# Patient Record
Sex: Female | Born: 1960 | Race: Black or African American | Hispanic: No | Marital: Married | Smoking: Never smoker
Health system: Southern US, Community
[De-identification: ages and names within clinical notes are randomized; demographics above are authoritative.]

## PROBLEM LIST (undated history)

## (undated) DIAGNOSIS — E1169 Type 2 diabetes mellitus with other specified complication: Secondary | ICD-10-CM

## (undated) DIAGNOSIS — E785 Hyperlipidemia, unspecified: Secondary | ICD-10-CM

## (undated) DIAGNOSIS — B9689 Other specified bacterial agents as the cause of diseases classified elsewhere: Secondary | ICD-10-CM

## (undated) DIAGNOSIS — R87629 Unspecified abnormal cytological findings in specimens from vagina: Secondary | ICD-10-CM

## (undated) DIAGNOSIS — T8859XA Other complications of anesthesia, initial encounter: Secondary | ICD-10-CM

## (undated) DIAGNOSIS — J45909 Unspecified asthma, uncomplicated: Secondary | ICD-10-CM

## (undated) DIAGNOSIS — N898 Other specified noninflammatory disorders of vagina: Secondary | ICD-10-CM

## (undated) DIAGNOSIS — IMO0002 Reserved for concepts with insufficient information to code with codable children: Secondary | ICD-10-CM

## (undated) DIAGNOSIS — T4145XA Adverse effect of unspecified anesthetic, initial encounter: Secondary | ICD-10-CM

## (undated) DIAGNOSIS — D649 Anemia, unspecified: Secondary | ICD-10-CM

## (undated) DIAGNOSIS — N760 Acute vaginitis: Secondary | ICD-10-CM

## (undated) DIAGNOSIS — R569 Unspecified convulsions: Secondary | ICD-10-CM

## (undated) HISTORY — DX: Anemia, unspecified: D64.9

## (undated) HISTORY — PX: TUBAL LIGATION: SHX77

## (undated) HISTORY — DX: Hyperlipidemia, unspecified: E78.5

## (undated) HISTORY — DX: Unspecified asthma, uncomplicated: J45.909

## (undated) HISTORY — DX: Unspecified convulsions: R56.9

## (undated) HISTORY — DX: Other specified bacterial agents as the cause of diseases classified elsewhere: B96.89

## (undated) HISTORY — DX: Reserved for concepts with insufficient information to code with codable children: IMO0002

## (undated) HISTORY — DX: Acute vaginitis: N76.0

## (undated) HISTORY — DX: Other specified noninflammatory disorders of vagina: N89.8

## (undated) HISTORY — DX: Unspecified abnormal cytological findings in specimens from vagina: R87.629

---
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
## 1988-01-11 DIAGNOSIS — R569 Unspecified convulsions: Secondary | ICD-10-CM

## 1988-01-11 HISTORY — DX: Unspecified convulsions: R56.9

## 1988-01-11 HISTORY — PX: BRAIN SURGERY: SHX531

## 1997-01-10 DIAGNOSIS — R87619 Unspecified abnormal cytological findings in specimens from cervix uteri: Secondary | ICD-10-CM

## 1997-01-10 DIAGNOSIS — IMO0002 Reserved for concepts with insufficient information to code with codable children: Secondary | ICD-10-CM

## 1997-01-10 HISTORY — DX: Reserved for concepts with insufficient information to code with codable children: IMO0002

## 1997-01-10 HISTORY — PX: GYNECOLOGIC CRYOSURGERY: SHX857

## 1997-01-10 HISTORY — DX: Unspecified abnormal cytological findings in specimens from cervix uteri: R87.619

## 2012-04-30 ENCOUNTER — Telehealth: Payer: Self-pay | Admitting: Nurse Practitioner

## 2012-04-30 ENCOUNTER — Encounter: Payer: Self-pay | Admitting: *Deleted

## 2012-04-30 NOTE — Telephone Encounter (Signed)
Appt made

## 2012-05-03 ENCOUNTER — Ambulatory Visit (INDEPENDENT_AMBULATORY_CARE_PROVIDER_SITE_OTHER): Payer: BC Managed Care – PPO | Admitting: Nurse Practitioner

## 2012-05-03 ENCOUNTER — Encounter: Payer: Self-pay | Admitting: Nurse Practitioner

## 2012-05-03 VITALS — BP 100/58 | HR 63 | Temp 98.3°F | Ht 62.0 in | Wt 158.0 lb

## 2012-05-03 DIAGNOSIS — N949 Unspecified condition associated with female genital organs and menstrual cycle: Secondary | ICD-10-CM

## 2012-05-03 DIAGNOSIS — N938 Other specified abnormal uterine and vaginal bleeding: Secondary | ICD-10-CM

## 2012-05-03 MED ORDER — MEDROXYPROGESTERONE ACETATE 10 MG PO TABS: ORAL_TABLET | ORAL | Status: DC

## 2012-05-03 NOTE — Progress Notes (Signed)
  Subjective:    Patient ID: Suzanne Jimenez, female    DOB: October 28, 1960, 52 y.o.   MRN: [id number]  HPI Pt presents today with menstrual cycle problems. Has been going on consistently for 5 weeks. Has been inconsistent for a over a year. Varies from day to day on how heavy the bleeding is. Pt is not on birth control.   Review of Systems  Genitourinary: Positive for menstrual problem. Negative for vaginal discharge, vaginal pain and pelvic pain.  All other systems reviewed and are negative.      Objective:   Physical Exam  Cardiovascular: Normal rate, regular rhythm and normal heart sounds.   Pulmonary/Chest: Effort normal and breath sounds normal.    BP 100/58  Pulse 63  Temp(Src) 98.3 F (36.8 C) (Oral)  Ht 5\' 2"  (1.575 m)  Wt 158 lb (71.668 kg)  BMI 28.89 kg/m2  Results for orders placed in visit on 05/03/12  POCT HEMOGLOBIN      Result Value Range   Hemoglobin 10.1 (*) 12.2 - 16.2 g/dL         Assessment & Plan:  1. Dysfunctional uterine bleeding Iron pill OTC Notify if bleeding does not stop - POCT hemoglobin - medroxyPROGESTERone (PROVERA) 10 MG tablet; Once a day for 10 days  Dispense: 10 tablet; Refill: 0   Mary-Margaret Daphine Deutscher, FNP

## 2012-05-03 NOTE — Patient Instructions (Addendum)
Dysfunctional Uterine Bleeding  Normally, menstrual periods begin between ages 11 to 17 in young women. A normal menstrual cycle/period may begin every 23 days up to 35 days and lasts from 1 to 7 days. Around 12 to 14 days before your menstrual period starts, ovulation (ovary produces an egg) occurs. When counting the time between menstrual periods, count from the first day of bleeding of the previous period to the first day of bleeding of the next period.  Dysfunctional (abnormal) uterine bleeding is bleeding that is different from a normal menstrual period. Your periods may come earlier or later than usual. They may be lighter, have blood clots or be heavier. You may have bleeding between periods, or you may skip one period or more. You may have bleeding after sexual intercourse, bleeding after menopause, or no menstrual period.  CAUSES   · Pregnancy (normal, miscarriage, tubal).  · IUDs (intrauterine device, birth control).  · Birth control pills.  · Hormone treatment.  · Menopause.  · Infection of the cervix.  · Blood clotting problems.  · Infection of the inside lining of the uterus.  · Endometriosis, inside lining of the uterus growing in the pelvis and other female organs.  · Adhesions (scar tissue) inside the uterus.  · Obesity or severe weight loss.  · Uterine polyps inside the uterus.  · Cancer of the vagina, cervix, or uterus.  · Ovarian cysts or polycystic ovary syndrome.  · Medical problems (diabetes, thyroid disease).  · Uterine fibroids (noncancerous tumor).  · Problems with your female hormones.  · Endometrial hyperplasia, very thick lining and enlarged cells inside of the uterus.  · Medicines that interfere with ovulation.  · Radiation to the pelvis or abdomen.  · Chemotherapy.  DIAGNOSIS   · Your doctor will discuss the history of your menstrual periods, medicines you are taking, changes in your weight, stress in your life, and any medical problems you may have.  · Your doctor will do a physical  and pelvic examination.  · Your doctor may want to perform certain tests to make a diagnosis, such as:  · Pap test.  · Blood tests.  · Cultures for infection.  · CT scan.  · Ultrasound.  · Hysteroscopy.  · Laparoscopy.  · MRI.  · Hysterosalpingography.  · D and C.  · Endometrial biopsy.  TREATMENT   Treatment will depend on the cause of the dysfunctional uterine bleeding (DUB). Treatment may include:  · Observing your menstrual periods for a couple of months.  · Prescribing medicines for medical problems, including:  · Antibiotics.  · Hormones.  · Birth control pills.  · Removing an IUD (intrauterine device, birth control).  · Surgery:  · D and C (scrape and remove tissue from inside the uterus).  · Laparoscopy (examine inside the abdomen with a lighted tube).  · Uterine ablation (destroy lining of the uterus with electrical current, laser, heat, or freezing).  · Hysteroscopy (examine cervix and uterus with a lighted tube).  · Hysterectomy (remove the uterus).  HOME CARE INSTRUCTIONS   · If medicines were prescribed, take exactly as directed. Do not change or switch medicines without consulting your caregiver.  · Long term heavy bleeding may result in iron deficiency. Your caregiver may have prescribed iron pills. They help replace the iron that your body lost from heavy bleeding. Take exactly as directed.  · Do not take aspirin or medicines that contain aspirin one week before or during your menstrual period. Aspirin may make   the bleeding worse.  · If you need to change your sanitary pad or tampon more than once every 2 hours, stay in bed with your feet elevated and a cold pack on your lower abdomen. Rest as much as possible, until the bleeding stops or slows down.  · Eat well-balanced meals. Eat foods high in iron. Examples are:  · Leafy green vegetables.  · Whole-grain breads and cereals.  · Eggs.  · Meat.  · Liver.  · Do not try to lose weight until the abnormal bleeding has stopped and your blood iron level is  back to normal. Do not lift more than ten pounds or do strenuous activities when you are bleeding.  · For a couple of months, make note on your calendar, marking the start and ending of your period, and the type of bleeding (light, medium, heavy, spotting, clots or missed periods). This is for your caregiver to better evaluate your problem.  SEEK MEDICAL CARE IF:   · You develop nausea (feeling sick to your stomach) and vomiting, dizziness, or diarrhea while you are taking your medicine.  · You are getting lightheaded or weak.  · You have any problems that may be related to the medicine you are taking.  · You develop pain with your DUB.  · You want to remove your IUD.  · You want to stop or change your birth control pills or hormones.  · You have any type of abnormal bleeding mentioned above.  · You are over 16 years old and have not had a menstrual period yet.  · You are 52 years old and you are still having menstrual periods.  · You have any of the symptoms mentioned above.  · You develop a rash.  SEEK IMMEDIATE MEDICAL CARE IF:   · An oral temperature above 102° F (38.9° C) develops.  · You develop chills.  · You are changing your sanitary pad or tampon more than once an hour.  · You develop abdominal pain.  · You pass out or faint.  Document Released: 12/25/1999 Document Revised: 03/21/2011 Document Reviewed: 11/25/2008  ExitCare® Patient Information ©2013 [employer].

## 2012-05-14 ENCOUNTER — Encounter: Payer: Self-pay | Admitting: Obstetrics and Gynecology

## 2012-06-22 ENCOUNTER — Ambulatory Visit (INDEPENDENT_AMBULATORY_CARE_PROVIDER_SITE_OTHER): Payer: BC Managed Care – PPO | Admitting: Family Medicine

## 2012-06-22 ENCOUNTER — Encounter: Payer: Self-pay | Admitting: Family Medicine

## 2012-06-22 VITALS — BP 120/82 | HR 75 | Temp 98.7°F | Wt 161.0 lb

## 2012-06-22 DIAGNOSIS — D649 Anemia, unspecified: Secondary | ICD-10-CM

## 2012-06-22 MED ORDER — FERROUS SULFATE 325 (65 FE) MG PO TABS: 325.0000 mg | ORAL_TABLET | Freq: Three times a day (TID) | ORAL | Status: DC

## 2012-06-22 NOTE — Progress Notes (Signed)
  Subjective:    Patient ID: Suzanne Jimenez, female    DOB: 1960-01-16, 52 y.o.   MRN: [id number]  HPI Patient presents today with chief complaint of irregular menses. Patient states she's been having irregular periods in the past 5-6 months. Patient states her she goes missing a period for about 1-2 months and then has extended cycles lasting 1-2 weeks Patient was recently seen for irregular menses and placed on a short course of Provera. Patient states that the bleeding subsided while on Provera but that quickly returned within 2-3 days of stopping the medication. Patient has had some secondary anemia from this with a baseline hemoglobin around 10.1. Patient was started on over-the-counter iron. Patient is unsure this is been effective or not is unclear the dosing. Patient denies any significant weakness however does have some mild to minimal fatigue.  Patient is also is not using more than one pad an hour. She had to use one to pad per day in the first 1-2 days of her cycle  Review of Systems  All other systems reviewed and are negative.       Objective:   Physical Exam  Constitutional: She appears well-developed and well-nourished.  HENT:  Head: Normocephalic and atraumatic.  Eyes: Conjunctivae are normal. Pupils are equal, round, and reactive to light.  Neck: Normal range of motion.  Cardiovascular: Normal rate and regular rhythm.   Pulmonary/Chest: Effort normal and breath sounds normal.  Abdominal: Soft.  Musculoskeletal: Normal range of motion.  Neurological: She is alert.  Skin: Skin is warm.          Assessment & Plan:  Anemia - Plan: Anemia panel, ferrous sulfate 325 (65 FE) MG tablet, CANCELED: CBC  Suspect this is likely perimenopausal bleeding. Hgb today stable.  Will check anemia panel.  Place on TID iron with miralax.  Plan for follow up with GYN for further evaluation.  Consider another trial of provera if GYN follow up is extended.

## 2012-06-23 LAB — ANEMIA PANEL
%SAT: 20 % (ref 20–55)
Folate: 11.2 ng/mL
Iron: 74 ug/dL (ref 42–145)
Retic Ct Pct: 1.1 % (ref 0.4–2.3)
TIBC: 378 ug/dL (ref 250–470)
UIBC: 304 ug/dL (ref 125–400)
Vitamin B-12: 443 pg/mL (ref 211–911)

## 2012-07-03 ENCOUNTER — Encounter: Payer: Self-pay | Admitting: Obstetrics & Gynecology

## 2012-07-03 ENCOUNTER — Ambulatory Visit (INDEPENDENT_AMBULATORY_CARE_PROVIDER_SITE_OTHER): Payer: BC Managed Care – PPO | Admitting: Obstetrics & Gynecology

## 2012-07-03 VITALS — BP 114/73 | HR 77 | Resp 16 | Wt 163.0 lb

## 2012-07-03 DIAGNOSIS — N938 Other specified abnormal uterine and vaginal bleeding: Secondary | ICD-10-CM | POA: Insufficient documentation

## 2012-07-03 DIAGNOSIS — N949 Unspecified condition associated with female genital organs and menstrual cycle: Secondary | ICD-10-CM

## 2012-07-03 MED ORDER — MEDROXYPROGESTERONE ACETATE 5 MG PO TABS: 5.0000 mg | ORAL_TABLET | Freq: Every day | ORAL | Status: DC

## 2012-07-03 NOTE — Progress Notes (Signed)
Patient ID: Suzanne Jimenez, female   DOB: 08/16/1960, 52 y.o.   MRN: [id number]  Chief Complaint  Patient presents with  . Menstrual Problem    20 days on 2 days off times 3months    HPI Suzanne Jimenez is a 52 y.o. female.  Patient's last menstrual period was 06/22/2012. [id number] Irregular, almost daily vaginal bleeding for last 3 months. Responded to short courses of Provera. Has VMS.   HPI  Past Medical History  Diagnosis Date  . Seizures 1990  . Asthma   . Anemia   . Hyperlipidemia   . Abnormal Pap smear 1999    Past Surgical History  Procedure Laterality Date  . Tubal ligation    . Brain surgery  1990  . Cesarean section  2004  . Gynecologic cryosurgery  1999    Abnormal Pap    Family History  Problem Relation Age of Onset  . Diabetes Mother   . Diabetes Father   . Heart disease Brother   . Heart disease Father   . Hypertension Mother   . Hypertension Father     Social History History  Substance Use Topics  . Smoking status: Never Smoker   . Smokeless tobacco: Never Used  . Alcohol Use: No    Allergies  Allergen Reactions  . Dilantin (Phenytoin)     Current Outpatient Prescriptions  Medication Sig Dispense Refill  . carbamazepine (TEGRETOL XR) 200 MG 12 hr tablet Take 200 mg by mouth 2 (two) times daily.      . ferrous sulfate 325 (65 FE) MG tablet Take 1 tablet (325 mg total) by mouth 3 (three) times daily with meals.  90 tablet  3  . fish oil-omega-3 fatty acids 1000 MG capsule Take 2 g by mouth daily.      . medroxyPROGESTERone (PROVERA) 5 MG tablet Take 1 tablet (5 mg total) by mouth daily.  30 tablet  6   No current facility-administered medications for this visit.    Review of Systems Review of Systems  Constitutional: Negative for fever.  Gastrointestinal: Negative for abdominal pain and abdominal distention.  Genitourinary: Positive for vaginal bleeding, vaginal discharge and menstrual problem.  Skin: Negative for  pallor.  Neurological: Negative for weakness.    Blood pressure 114/73, pulse 77, resp. rate 16, weight 163 lb (73.936 kg), last menstrual period 06/22/2012.  Physical Exam Physical Exam  Constitutional: She is oriented to person, place, and time. She appears well-developed. No distress.  Pulmonary/Chest: Effort normal. No respiratory distress.  Abdominal: Soft. She exhibits no distension and no mass. There is no tenderness.  Genitourinary: Vagina normal and uterus normal. No vaginal discharge found.  Spotting, no masses  Neurological: She is alert and oriented to person, place, and time.  Skin: Skin is warm and dry.  Psychiatric: She has a normal mood and affect. Her behavior is normal.    Data Reviewed CBC    Component Value Date/Time   HGB 10.1* 05/03/2012 1252      Assessment    Perimenopausal vaginal bleeding      Plan    Provera 5 mg daily Pelvic US, f/u after test performed        Chanc Kervin 07/03/2012, 11:28 AM

## 2012-07-03 NOTE — Patient Instructions (Signed)

## 2012-07-10 ENCOUNTER — Ambulatory Visit (HOSPITAL_COMMUNITY)
Admission: RE | Admit: 2012-07-10 | Discharge: 2012-07-10 | Disposition: A | Discharge status: Home / Self Care | Payer: BC Managed Care – PPO | Source: Ambulatory Visit | Attending: Obstetrics & Gynecology | Admitting: Obstetrics & Gynecology

## 2012-07-10 ENCOUNTER — Other Ambulatory Visit (HOSPITAL_COMMUNITY): Payer: BC Managed Care – PPO

## 2012-07-10 DIAGNOSIS — N72 Inflammatory disease of cervix uteri: Secondary | ICD-10-CM | POA: Insufficient documentation

## 2012-07-10 DIAGNOSIS — N938 Other specified abnormal uterine and vaginal bleeding: Secondary | ICD-10-CM | POA: Insufficient documentation

## 2012-07-10 DIAGNOSIS — R9389 Abnormal findings on diagnostic imaging of other specified body structures: Secondary | ICD-10-CM | POA: Insufficient documentation

## 2012-07-10 DIAGNOSIS — N949 Unspecified condition associated with female genital organs and menstrual cycle: Secondary | ICD-10-CM | POA: Insufficient documentation

## 2012-07-24 ENCOUNTER — Encounter: Payer: Self-pay | Admitting: Obstetrics & Gynecology

## 2012-07-24 ENCOUNTER — Ambulatory Visit (INDEPENDENT_AMBULATORY_CARE_PROVIDER_SITE_OTHER): Payer: BC Managed Care – PPO | Admitting: Obstetrics & Gynecology

## 2012-07-24 VITALS — BP 120/75 | HR 79 | Resp 16 | Ht 62.0 in | Wt 161.0 lb

## 2012-07-24 DIAGNOSIS — N938 Other specified abnormal uterine and vaginal bleeding: Secondary | ICD-10-CM

## 2012-07-24 DIAGNOSIS — N949 Unspecified condition associated with female genital organs and menstrual cycle: Secondary | ICD-10-CM

## 2012-07-24 NOTE — Progress Notes (Signed)
  Subjective:    Patient ID: Suzanne Jimenez, female    DOB: 04/12/1960, 51 y.o.   MRN: [id number]  HPI [id number] Patient's last menstrual period was 06/14/2012.  No bleeding for 1 week, no Provera now.   Review of Systems  Constitutional: Negative for fever.  Genitourinary: Positive for menstrual problem. Negative for vaginal bleeding, vaginal discharge and pelvic pain.       Objective:   Physical Exam  Nursing note and vitals reviewed. Constitutional: She is oriented to person, place, and time. She appears well-developed and well-nourished. No distress.  Pulmonary/Chest: Effort normal and breath sounds normal.  Neurological: She is alert and oriented to person, place, and time.  Psychiatric: She has a normal mood and affect. Her behavior is normal.      *RADIOLOGY REPORT*  Clinical Data: Dysfunctional uterine bleeding  TRANSABDOMINAL AND TRANSVAGINAL ULTRASOUND OF PELVIS  Technique: Both transabdominal and transvaginal ultrasound  examinations of the pelvis were performed. Transabdominal technique  was performed for global imaging of the pelvis including uterus,  ovaries, adnexal regions, and pelvic cul-de-sac.  It was necessary to proceed with endovaginal exam following the  transabdominal exam to visualize the endometrial complex and right  ovary.  Comparison: None  Findings:  Uterus: 12.0 x 4.8 x 6.8 cm. Normal morphology without mass.  Nabothian cysts at cervix.  Endometrium: Thickened, 17 mm thick. No endometrial fluid.  Right ovary: 2.5 x 1.5 1.4 cm. Normal morphology without mass.  Left ovary: 2.6 x 1.5 x 2.6 cm. Seen only on transabdominal  imaging due to high position. Normal morphology without mass.  Other findings: No free fluid or adnexal masses.  IMPRESSION:  Thickened endometrial complex, nonspecific, can be seen with  hyperplasia, polyps, and tumor.  Otherwise negative exam.      Assessment & Plan:  DUB, thickened endometrium. Offered EMB,  will return after using cytotec and ibuprofen.  Adam Phenix, MD 07/24/2012

## 2012-07-24 NOTE — Patient Instructions (Signed)
Endometrial Biopsy This is a test in which a tissue sample (a biopsy) is taken from inside the uterus (womb). It is then looked at by a specialist under a microscope to see if the tissue is normal or abnormal. The endometrium is the lining of the uterus. This test helps determine where you are in your menstrual cycle and how hormone levels are affecting the lining of the uterus. Another use for this test is to diagnose endometrial cancer, tuberculosis, polyps, or inflammatory conditions and to evaluate uterine bleeding. PREPARATION FOR TEST No preparation or fasting is necessary. NORMAL FINDINGS No pathologic conditions. Presence of "secretory-type" endometrium 3 to 5 days before to normal menstruation. Ranges for normal findings may vary among different laboratories and hospitals. You should always check with your doctor after having lab work or other tests done to discuss the meaning of your test results and whether your values are considered within normal limits. MEANING OF TEST  Your caregiver will go over the test results with you and discuss the importance and meaning of your results, as well as treatment options and the need for additional tests if necessary. OBTAINING THE TEST RESULTS It is your responsibility to obtain your test results. Ask the lab or department performing the test when and how you will get your results. Document Released: 04/29/2004 Document Revised: 03/21/2011 Document Reviewed: 12/07/2007 ExitCare Patient Information 2014 [employer].  

## 2012-08-01 ENCOUNTER — Other Ambulatory Visit: Payer: Self-pay | Admitting: *Deleted

## 2012-08-01 DIAGNOSIS — N938 Other specified abnormal uterine and vaginal bleeding: Secondary | ICD-10-CM

## 2012-08-01 MED ORDER — MISOPROSTOL 200 MCG PO TABS: ORAL_TABLET | ORAL | Status: DC

## 2012-08-01 NOTE — Progress Notes (Signed)
Pt called because she went to Pharm to pick up cytotec but it was not sent in. I e-scribed Cytoteck 400mg  PV night before procedure. Sent to [facility] as requested by patient. Pt is to have Endo Bx next week with Dr. Debroah Loop.

## 2012-08-07 ENCOUNTER — Ambulatory Visit (INDEPENDENT_AMBULATORY_CARE_PROVIDER_SITE_OTHER): Payer: BC Managed Care – PPO | Admitting: Obstetrics & Gynecology

## 2012-08-07 VITALS — BP 132/79 | HR 88 | Resp 16 | Ht 63.0 in | Wt 160.0 lb

## 2012-08-07 DIAGNOSIS — N949 Unspecified condition associated with female genital organs and menstrual cycle: Secondary | ICD-10-CM

## 2012-08-07 DIAGNOSIS — Z01812 Encounter for preprocedural laboratory examination: Secondary | ICD-10-CM

## 2012-08-07 DIAGNOSIS — N938 Other specified abnormal uterine and vaginal bleeding: Secondary | ICD-10-CM

## 2012-08-07 LAB — POCT URINE PREGNANCY: Preg Test, Ur: NEGATIVE

## 2012-08-07 NOTE — Addendum Note (Signed)
Addended by: Granville Lewis on: 08/07/2012 12:15 PM   Modules accepted: Orders

## 2012-08-07 NOTE — Progress Notes (Signed)
Patient ID: Suzanne Jimenez, female   DOB: 12-Sep-1960, 52 y.o.   MRN: [id number] Patient's last menstrual period was 06/14/2012. Returns for endometrial biopsy for DUB and thickened endometrium Patient given informed consent, signed copy in the chart, time out was performed. Appropriate time out taken. . The patient was placed in the lithotomy position and the cervix brought into view with sterile speculum.  Portio of cervix cleansed x 2 with betadine swabs.  A tenaculum was placed in the anterior lip of the cervix.  The uterus was sounded for depth of 9 cm. A pipelle was introduced to into the uterus, suction created,  and an endometrial sample was obtained. All equipment was removed and accounted for.  The patient tolerated the procedure well.    Patient given post procedure instructions. The patient will return in 2 weeks for results.  Adam Phenix, MD 08/07/2012

## 2012-08-07 NOTE — Patient Instructions (Signed)

## 2012-08-13 ENCOUNTER — Encounter: Payer: Self-pay | Admitting: *Deleted

## 2012-08-21 ENCOUNTER — Ambulatory Visit (INDEPENDENT_AMBULATORY_CARE_PROVIDER_SITE_OTHER): Payer: BC Managed Care – PPO | Admitting: Obstetrics & Gynecology

## 2012-08-21 ENCOUNTER — Encounter: Payer: Self-pay | Admitting: Obstetrics & Gynecology

## 2012-08-21 VITALS — BP 110/75 | HR 74 | Resp 16 | Ht 62.0 in | Wt 163.0 lb

## 2012-08-21 DIAGNOSIS — N938 Other specified abnormal uterine and vaginal bleeding: Secondary | ICD-10-CM

## 2012-08-21 DIAGNOSIS — N949 Unspecified condition associated with female genital organs and menstrual cycle: Secondary | ICD-10-CM

## 2012-08-21 MED ORDER — MEDROXYPROGESTERONE ACETATE 5 MG PO TABS: ORAL_TABLET | ORAL | Status: DC

## 2012-08-21 NOTE — Progress Notes (Signed)
Patient ID: Suzanne Jimenez, female   DOB: April 04, 1960, 52 y.o.   MRN: [id number] Patient's last menstrual period was 08/12/2012. Endometrial biopsy was benign. We discussed her sx of DUB and she would like cyclic hormonal therapy. I prescribed Provera 5 mg day 1-10 each month, with routine follow-up here or at [facility]. Pap smear every 3-5 years depending on with or without HPV cotesting, yearly mammography.   Adam Phenix, MD 08/21/2012

## 2012-08-21 NOTE — Patient Instructions (Signed)
Perimenopause Perimenopause is the time when your body begins to move into the menopause (no menstrual period for 12 straight months). It is a natural process. Perimenopause can begin 2 to 8 years before the menopause and usually lasts for one year after the menopause. During this time, your ovaries may or may not produce an egg. The ovaries vary in their production of estrogen and progesterone hormones each month. This can cause irregular menstrual periods, difficulty in getting pregnant, vaginal bleeding between periods and uncomfortable symptoms. CAUSES  Irregular production of the ovarian hormones, estrogen and progesterone, and not ovulating every month.  Other causes include:  Tumor of the pituitary gland in the brain.  Medical disease that affects the ovaries.  Radiation treatment.  Chemotherapy.  Unknown causes.  Heavy smoking and excessive alcohol intake can bring on perimenopause sooner. SYMPTOMS   Hot flashes.  Night sweats.  Irregular menstrual periods.  Decrease sex drive.  Vaginal dryness.  Headaches.  Mood swings.  Depression.  Memory problems.  Irritability.  Tiredness.  Weight gain.  Trouble getting pregnant.  The beginning of losing bone cells (osteoporosis).  The beginning of hardening of the arteries (atherosclerosis). DIAGNOSIS  Your caregiver will make a diagnosis by analyzing your age, menstrual history and your symptoms. They will do a physical exam noting any changes in your body, especially your female organs. Female hormone tests may or may not be helpful depending on the amount and when you produce the female hormones. However, other hormone tests may be helpful (ex. thyroid hormone) to rule out other problems. TREATMENT  The decision to treat during the perimenopause should be made by you and your caregiver depending on how the symptoms are affecting you and your life style. There are various treatments available such as:  Treating  individual symptoms with a specific medication for that symptom (ex. tranquilizer for depression).  Herbal medications that can help specific symptoms.  Counseling.  Group therapy.  No treatment. HOME CARE INSTRUCTIONS   Before seeing your caregiver, make a list of your menstrual periods (when the occur, how heavy they are, how long between periods and how long they last), your symptoms and when they started.  Take the medication as recommended by your caregiver.  Sleep and rest.  Exercise.  Eat a diet that contains calcium (good for your bones) and soy (acts like estrogen hormone).  Do not smoke.  Avoid alcoholic beverages.  Taking vitamin E may help in certain cases.  Take calcium and vitamin D supplements to help prevent bone loss.  Group therapy is sometimes helpful.  Acupuncture may help in some cases. SEEK MEDICAL CARE IF:   You have any of the above and want to know if it is perimenopause.  You want advice and treatment for any of your symptoms mentioned above.  You need a referral to a specialist (gynecologist, psychiatrist or psychologist). SEEK IMMEDIATE MEDICAL CARE IF:   You have vaginal bleeding.  Your period lasts longer than 8 days.  You periods are recurring sooner than 21 days.  You have bleeding after intercourse.  You have severe depression.  You have pain when you urinate.  You have severe headaches.  You develop vision problems. Document Released: 02/04/2004 Document Revised: 03/21/2011 Document Reviewed: 10/25/2007 ExitCare Patient Information 2014 [employer].  

## 2012-09-20 ENCOUNTER — Telehealth: Payer: Self-pay | Admitting: Nurse Practitioner

## 2012-09-25 NOTE — Telephone Encounter (Signed)
Spoke with pt. She usually has her labs drawn before her appointment (from an outside lab [facility] she works]) and would like [place] to mail her the order so that she can have them done and bring the results to her appointment on 10/18/2012.

## 2012-09-25 NOTE — Telephone Encounter (Signed)
In the past the order has come from PCP since we do not monitor the routine labs on yearly visits. They have ordered her CBZ level.

## 2012-10-05 ENCOUNTER — Telehealth: Payer: Self-pay | Admitting: Nurse Practitioner

## 2012-10-05 DIAGNOSIS — Z79899 Other long term (current) drug therapy: Secondary | ICD-10-CM

## 2012-10-05 NOTE — Telephone Encounter (Signed)
Ordered put in for tegretol level to be done next week

## 2012-10-08 NOTE — Telephone Encounter (Signed)
Wants the order for her to get it done at her work not here

## 2012-10-08 NOTE — Telephone Encounter (Signed)
I can 't order labs to be done at work

## 2012-10-09 NOTE — Telephone Encounter (Signed)
Can you not just write it down for her to take to work?

## 2012-10-10 ENCOUNTER — Telehealth: Payer: Self-pay | Admitting: Nurse Practitioner

## 2012-10-10 NOTE — Telephone Encounter (Signed)
Who is going to draw labs at work

## 2012-10-10 NOTE — Telephone Encounter (Signed)
A nurse at work

## 2012-10-11 NOTE — Telephone Encounter (Signed)
Up front patient aware 

## 2012-10-17 ENCOUNTER — Ambulatory Visit (INDEPENDENT_AMBULATORY_CARE_PROVIDER_SITE_OTHER): Payer: BC Managed Care – PPO

## 2012-10-17 ENCOUNTER — Ambulatory Visit: Payer: BC Managed Care – PPO | Admitting: Nurse Practitioner

## 2012-10-17 DIAGNOSIS — Z23 Encounter for immunization: Secondary | ICD-10-CM

## 2012-10-22 ENCOUNTER — Other Ambulatory Visit: Payer: Self-pay | Admitting: *Deleted

## 2012-10-22 MED ORDER — CARBAMAZEPINE ER 200 MG PO TB12: 200.0000 mg | ORAL_TABLET | Freq: Two times a day (BID) | ORAL | Status: DC

## 2012-10-22 NOTE — Telephone Encounter (Signed)
Last seen 04/14, seizure medicine is not on my protocol, tegretal level?

## 2012-10-23 ENCOUNTER — Encounter: Payer: Self-pay | Admitting: Nurse Practitioner

## 2012-10-23 ENCOUNTER — Ambulatory Visit (INDEPENDENT_AMBULATORY_CARE_PROVIDER_SITE_OTHER): Payer: BC Managed Care – PPO | Admitting: Nurse Practitioner

## 2012-10-23 VITALS — BP 107/72 | HR 90 | Ht 62.0 in | Wt 163.0 lb

## 2012-10-23 DIAGNOSIS — G40109 Localization-related (focal) (partial) symptomatic epilepsy and epileptic syndromes with simple partial seizures, not intractable, without status epilepticus: Secondary | ICD-10-CM

## 2012-10-23 MED ORDER — CARBAMAZEPINE ER 200 MG PO TB12: 200.0000 mg | ORAL_TABLET | Freq: Two times a day (BID) | ORAL | Status: DC

## 2012-10-23 NOTE — Patient Instructions (Signed)
Continue Tegretol at current dose refill for one year Tegretol level 9.4 which is normal Follow up yearly and when necessary

## 2012-10-23 NOTE — Progress Notes (Signed)
[facility]  PATIENT: Suzanne Jimenez DOB: January 25, 1960   REASON FOR VISIT: Followup for seizure disorder   HISTORY OF PRESENT ILLNESS: Ms Recinos, 52 -year-old female returns today for followup. 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 She had surgical removal of the AV malformation in the left and seizures have been under good control since that time. She is currently on Tegretol twice daily. She denies any side effects to the medication, no daytime drowsiness, no feelings of being off balance. She denies missing any doses. She denies any headache. She has had no falls. Appetite is good, sleeping well.  No new neurologic complaints. Recent CBZ level was 9.4.    REVIEW OF SYSTEMS: Full 14 system review of systems performed and notable only for:  Constitutional: Weight gain  Cardiovascular: N/A  Ear/Nose/Throat: N/A  Skin: N/A  Eyes: N/A  Respiratory: N/A  Gastroitestinal: N/A  Hematology/Lymphatic: N/A  Endocrine: N/A Musculoskeletal:N/A  Allergy/Immunology: N/A  Neurological: N/A Psychiatric: N/A   ALLERGIES: Allergies  Allergen Reactions  . Dilantin [Phenytoin]     HOME MEDICATIONS: Outpatient Prescriptions Prior to Visit  Medication Sig Dispense Refill  . carbamazepine (TEGRETOL XR) 200 MG 12 hr tablet Take 1 tablet (200 mg total) by mouth 2 (two) times daily.  60 tablet  1  . cholecalciferol (VITAMIN D) 1000 UNITS tablet Take 1,000 Units by mouth daily.      . fish oil-omega-3 fatty acids 1000 MG capsule Take 2 g by mouth daily.      . ferrous sulfate 325 (65 FE) MG tablet Take 1 tablet (325 mg total) by mouth 3 (three) times daily with meals.  90 tablet  3  . medroxyPROGESTERone (PROVERA) 5 MG tablet 1 by mouth daily, day 1-10 each month  30 tablet  6  . misoprostol (CYTOTEC) 200 MCG tablet Place two tablets in the vagina the night prior to your next clinic appointment  2 tablet  2   No facility-administered medications  prior to visit.    PAST MEDICAL HISTORY: Past Medical History  Diagnosis Date  . Seizures 1990  . Asthma   . Anemia   . Hyperlipidemia   . Abnormal Pap smear 1999    PAST SURGICAL HISTORY: Past Surgical History  Procedure Laterality Date  . Tubal ligation    . Brain surgery  1990  . Cesarean section  2004  . Gynecologic cryosurgery  1999    Abnormal Pap    FAMILY HISTORY: Family History  Problem Relation Age of Onset  . Diabetes Mother   . Hypertension Mother   . Diabetes Father   . Heart disease Father   . Hypertension Father   . Heart disease Brother     SOCIAL HISTORY: History   Social History  . Marital Status: Married    Spouse Name: N/A    Number of Children: 2  . Years of Education: BS   Occupational History  . Social worker at [employer]   Social History Main Topics  . Smoking status: Never Smoker   . Smokeless tobacco: Never Used  . Alcohol Use: No  . Drug Use: No  . Sexual Activity: Yes    Partners: Male    Birth Control/ Protection: Surgical   Other Topics Concern  . Not on file   Social History Narrative  . No narrative on file     PHYSICAL EXAM  Filed Vitals:   10/23/12 1522  BP: 107/72  Pulse: 90  Height: 5'  2" (1.575 m)  Weight: 163 lb (73.936 kg)   Body mass index is 29.81 kg/(m^2).  Generalized: Well developed, in no acute distress  Head: normocephalic and atraumatic,. Oropharynx benign  Neck: Supple, no carotid bruits  Cardiac: Regular rate rhythm, no murmur  Musculoskeletal: No deformity   Neurological examination   Mentation: Alert oriented to time, place, history taking. Follows all commands speech and language fluent  Cranial nerve II-XII: Pupils were equal round reactive to light extraocular movements were full, visual field were full on confrontational test. Facial sensation and strength were normal. hearing was intact to finger rubbing bilaterally. Uvula tongue midline. head turning and shoulder shrug and were  normal and symmetric.Tongue protrusion into cheek strength was normal. Motor: normal bulk and tone, full strength in the BUE, BLE, fine finger movements normal, no pronator drift. No focal weakness Coordination: finger-nose-finger, heel-to-shin bilaterally, no dysmetria Reflexes: Brachioradialis 2/2, biceps 2/2, triceps 2/2, patellar 2/2, Achilles 2/2, plantar responses were flexor bilaterally. Gait and Station: Rising up from seated position without assistance, normal stance, without trunk ataxia, moderate stride, good arm swing, smooth turning, able to perform tiptoe, and heel walking without difficulty.   DIAGNOSTIC DATA (LABS, IMAGING, TESTING) - I reviewed patient records, labs, notes, testing and imaging myself where available.  Lab Results  Component Value Date   HGB 10.1* 05/03/2012      Lab Results  Component Value Date   VITAMINB12 443 06/22/2012       ASSESSMENT AND PLAN  52 y.o. year old female  has a past medical history of Seizures (1990); Asthma; Anemia; Hyperlipidemia;  here to followup. Recent carbamazepine level 9.4  Continue Tegretol at current dose refill for one year Follow up yearly and when necessary   Nilda Riggs, [facility], [facility], APRN  [facility] [address], [address] [place], [place] [address] [phone]

## 2012-10-29 ENCOUNTER — Telehealth: Payer: Self-pay | Admitting: Nurse Practitioner

## 2012-10-29 MED ORDER — CARBAMAZEPINE 200 MG PO TABS: 200.0000 mg | ORAL_TABLET | Freq: Two times a day (BID) | ORAL | Status: DC

## 2012-10-29 NOTE — Telephone Encounter (Signed)
New script for non extened release sent to pharmacy- both were on chart and wrong one was filled

## 2012-11-15 ENCOUNTER — Encounter: Payer: Self-pay | Admitting: Nurse Practitioner

## 2012-11-22 NOTE — Telephone Encounter (Signed)
I called pharmacy. Patient had regular Tegretol filled not EXtended release. She still has 3 refills according to them. Pt was called and made aware.

## 2013-01-08 ENCOUNTER — Telehealth: Payer: Self-pay | Admitting: Family Medicine

## 2013-01-08 ENCOUNTER — Ambulatory Visit (INDEPENDENT_AMBULATORY_CARE_PROVIDER_SITE_OTHER): Payer: BC Managed Care – PPO | Admitting: Family Medicine

## 2013-01-08 ENCOUNTER — Encounter: Payer: Self-pay | Admitting: Family Medicine

## 2013-01-08 VITALS — BP 105/70 | HR 91 | Temp 99.5°F | Ht 62.0 in | Wt 161.0 lb

## 2013-01-08 DIAGNOSIS — R300 Dysuria: Secondary | ICD-10-CM

## 2013-01-08 DIAGNOSIS — R569 Unspecified convulsions: Secondary | ICD-10-CM

## 2013-01-08 DIAGNOSIS — E559 Vitamin D deficiency, unspecified: Secondary | ICD-10-CM

## 2013-01-08 DIAGNOSIS — Z0000 Encounter for general adult medical examination without abnormal findings: Secondary | ICD-10-CM

## 2013-01-08 LAB — POCT UA - MICROSCOPIC ONLY
Casts, Ur, LPF, POC: NEGATIVE
Crystals, Ur, HPF, POC: NEGATIVE
Mucus, UA: NEGATIVE
Yeast, UA: NEGATIVE

## 2013-01-08 LAB — POCT URINALYSIS DIPSTICK

## 2013-01-08 MED ORDER — CARBAMAZEPINE 200 MG PO TABS: 200.0000 mg | ORAL_TABLET | Freq: Two times a day (BID) | ORAL | Status: DC

## 2013-01-08 MED ORDER — CIPROFLOXACIN HCL 500 MG PO TABS: 500.0000 mg | ORAL_TABLET | Freq: Two times a day (BID) | ORAL | Status: DC

## 2013-01-08 MED ORDER — BENZONATATE 100 MG PO CAPS: 100.0000 mg | ORAL_CAPSULE | Freq: Three times a day (TID) | ORAL | Status: DC | PRN

## 2013-01-08 NOTE — Progress Notes (Signed)
   Subjective:    Patient ID: Suzanne Jimenez Current, female    DOB: Aug 03, 1960, 52 y.o.   MRN: [id number]  HPI This 52 y.o. female presents for evaluation of urinary frequency and URI sx's for a week. She also needs refills on her tegretol.  She has hx of sz disorder.   Review of Systems C/o urinary and uri sx's No chest pain, SOB, HA, dizziness, vision change, N/V, diarrhea, constipation, myalgias, arthralgias or rash.     Objective:   Physical Exam Vital signs noted  Well developed well nourished female.  HEENT - Head atraumatic Normocephalic                Eyes - PERRLA, Conjuctiva - clear Sclera- Clear EOMI                Ears - EAC's Wnl TM's Wnl Gross Hearing WNL                Nose - Nares patent                 Throat - oropharanx wnl Respiratory - Lungs CTA bilateral Cardiac - RRR S1 and S2 without murmur GI - Abdomen soft Nontender and bowel sounds active x 4 Extremities - No edema. Neuro - Grossly intact.   Results for orders placed in visit on 01/08/13  POCT UA - MICROSCOPIC ONLY      Result Value Range   WBC, Ur, HPF, POC 1-5 W/ CLUE CELLS     RBC, urine, microscopic TNTC     Bacteria, U Microscopic OCC     Mucus, UA NEG     Epithelial cells, urine per micros OCC     Crystals, Ur, HPF, POC NEG     Casts, Ur, LPF, POC NEG     Yeast, UA NEG    POCT URINALYSIS DIPSTICK      Result Value Range   Color, UA RED     Clarity, UA CLOUDY     Glucose, UA COLOR INTERFERENCE     Bilirubin, UA COLOR INTERFERENCE     Ketones, UA COLOR INTERFERENCE     Blood, UA COLOR INTERFERENCE     Protein, UA COLOR INTERFERENCE     Urobilinogen, UA       Nitrite, UA COLOR INTERFERENCE        Assessment & Plan:  Dysuria - Plan: POCT UA - Microscopic Only, POCT urinalysis dipstick, ciprofloxacin (CIPRO) 500 MG tablet, benzonatate (TESSALON PERLES) 100 MG capsule  Healthcare maintenance - Plan: MM Digital Screening, Ambulatory referral to Gastroenterology, ciprofloxacin  (CIPRO) 500 MG tablet, benzonatate (TESSALON PERLES) 100 MG capsule, POCT CBC, CMP14+EGFR, Thyroid Panel With TSH, CANCELED: MM Digital Screening Unilat L  Convulsions/seizures - Plan: Carbamazepine level, total, carbamazepine (TEGRETOL) 200 MG tablet  Unspecified vitamin D deficiency - Plan: Vit D  25 hydroxy (rtn osteoporosis monitoring)  Deatra Canter FNP

## 2013-01-08 NOTE — Telephone Encounter (Signed)
Appt today at 10:45 with bill oxford

## 2013-01-08 NOTE — Patient Instructions (Signed)
Urinary Tract Infection  Urinary tract infections (UTIs) can develop anywhere along your urinary tract. Your urinary tract is your body's drainage system for removing wastes and extra water. Your urinary tract includes two kidneys, two ureters, a bladder, and a urethra. Your kidneys are a pair of bean-shaped organs. Each kidney is about the size of your fist. They are located below your ribs, one on each side of your spine.  CAUSES  Infections are caused by microbes, which are microscopic organisms, including fungi, viruses, and bacteria. These organisms are so small that they can only be seen through a microscope. Bacteria are the microbes that most commonly cause UTIs.  SYMPTOMS   Symptoms of UTIs may vary by age and gender of the patient and by the location of the infection. Symptoms in young women typically include a frequent and intense urge to urinate and a painful, burning feeling in the bladder or urethra during urination. Older women and men are more likely to be tired, shaky, and weak and have muscle aches and abdominal pain. A fever may mean the infection is in your kidneys. Other symptoms of a kidney infection include pain in your back or sides below the ribs, nausea, and vomiting.  DIAGNOSIS  To diagnose a UTI, your caregiver will ask you about your symptoms. Your caregiver also will ask to provide a urine sample. The urine sample will be tested for bacteria and white blood cells. White blood cells are made by your body to help fight infection.  TREATMENT   Typically, UTIs can be treated with medication. Because most UTIs are caused by a bacterial infection, they usually can be treated with the use of antibiotics. The choice of antibiotic and length of treatment depend on your symptoms and the type of bacteria causing your infection.  HOME CARE INSTRUCTIONS   If you were prescribed antibiotics, take them exactly as your caregiver instructs you. Finish the medication even if you feel better after you  have only taken some of the medication.   Drink enough water and fluids to keep your urine clear or pale yellow.   Avoid caffeine, tea, and carbonated beverages. They tend to irritate your bladder.   Empty your bladder often. Avoid holding urine for long periods of time.   Empty your bladder before and after sexual intercourse.   After a bowel movement, women should cleanse from front to back. Use each tissue only once.  SEEK MEDICAL CARE IF:    You have back pain.   You develop a fever.   Your symptoms do not begin to resolve within 3 days.  SEEK IMMEDIATE MEDICAL CARE IF:    You have severe back pain or lower abdominal pain.   You develop chills.   You have nausea or vomiting.   You have continued burning or discomfort with urination.  MAKE SURE YOU:    Understand these instructions.   Will watch your condition.   Will get help right away if you are not doing well or get worse.  Document Released: 10/06/2004 Document Revised: 06/28/2011 Document Reviewed: 02/04/2011  ExitCare Patient Information 2014 [employer].

## 2013-01-09 LAB — CBC WITH DIFFERENTIAL
Basophils Absolute: 0 10*3/uL (ref 0.0–0.2)
Basos: 0 %
Eos: 2 %
Eosinophils Absolute: 0 10*3/uL (ref 0.0–0.4)
HCT: 33.5 % — ABNORMAL LOW (ref 34.0–46.6)
Hemoglobin: 11 g/dL — ABNORMAL LOW (ref 11.1–15.9)
Immature Grans (Abs): 0 10*3/uL (ref 0.0–0.1)
Immature Granulocytes: 0 %
Lymphocytes Absolute: 1.3 10*3/uL (ref 0.7–3.1)
Lymphs: 48 %
MCH: 28 pg (ref 26.6–33.0)
MCHC: 32.8 g/dL (ref 31.5–35.7)
MCV: 85 fL (ref 79–97)
Monocytes Absolute: 0.5 10*3/uL (ref 0.1–0.9)
Monocytes: 19 %
Neutrophils Absolute: 0.8 10*3/uL — ABNORMAL LOW (ref 1.4–7.0)
Neutrophils Relative %: 31 %
Platelets: 225 10*3/uL (ref 150–379)
RBC: 3.93 x10E6/uL (ref 3.77–5.28)
RDW: 14.7 % (ref 12.3–15.4)
WBC: 2.7 10*3/uL — ABNORMAL LOW (ref 3.4–10.8)

## 2013-01-09 LAB — THYROID PANEL WITH TSH
Free Thyroxine Index: 1.6 (ref 1.2–4.9)
T3 Uptake Ratio: 25 % (ref 24–39)
T4, Total: 6.2 ug/dL (ref 4.5–12.0)
TSH: 0.956 u[IU]/mL (ref 0.450–4.500)

## 2013-01-09 LAB — CMP14+EGFR
ALT: 20 IU/L (ref 0–32)
AST: 28 IU/L (ref 0–40)
Albumin/Globulin Ratio: 1.7 (ref 1.1–2.5)
Albumin: 4 g/dL (ref 3.5–5.5)
Alkaline Phosphatase: 69 IU/L (ref 39–117)
BUN/Creatinine Ratio: 11 (ref 9–23)
BUN: 9 mg/dL (ref 6–24)
CO2: 24 mmol/L (ref 18–29)
Calcium: 8.6 mg/dL — ABNORMAL LOW (ref 8.7–10.2)
Chloride: 100 mmol/L (ref 97–108)
Creatinine, Ser: 0.85 mg/dL (ref 0.57–1.00)
GFR calc Af Amer: 92 mL/min/{1.73_m2} (ref >59)
GFR calc non Af Amer: 80 mL/min/{1.73_m2} (ref >59)
Globulin, Total: 2.3 g/dL (ref 1.5–4.5)
Glucose: 119 mg/dL — ABNORMAL HIGH (ref 65–99)
Potassium: 3.2 mmol/L — ABNORMAL LOW (ref 3.5–5.2)
Sodium: 140 mmol/L (ref 134–144)
Total Bilirubin: <0.2 mg/dL (ref 0.0–1.2)
Total Protein: 6.3 g/dL (ref 6.0–8.5)

## 2013-01-09 LAB — VITAMIN D 25 HYDROXY (VIT D DEFICIENCY, FRACTURES): Vit D, 25-Hydroxy: 25.8 ng/mL — ABNORMAL LOW (ref 30.0–100.0)

## 2013-01-09 LAB — CARBAMAZEPINE LEVEL, TOTAL: Carbamazepine Lvl: 4.9 ug/mL (ref 4.0–12.0)

## 2013-01-14 ENCOUNTER — Encounter (INDEPENDENT_AMBULATORY_CARE_PROVIDER_SITE_OTHER): Payer: Self-pay | Admitting: *Deleted

## 2013-01-17 ENCOUNTER — Encounter (INDEPENDENT_AMBULATORY_CARE_PROVIDER_SITE_OTHER): Payer: Self-pay | Admitting: *Deleted

## 2013-01-17 ENCOUNTER — Telehealth (INDEPENDENT_AMBULATORY_CARE_PROVIDER_SITE_OTHER): Payer: Self-pay | Admitting: *Deleted

## 2013-01-17 ENCOUNTER — Other Ambulatory Visit (INDEPENDENT_AMBULATORY_CARE_PROVIDER_SITE_OTHER): Payer: Self-pay | Admitting: *Deleted

## 2013-01-17 DIAGNOSIS — Z1211 Encounter for screening for malignant neoplasm of colon: Secondary | ICD-10-CM

## 2013-01-17 MED ORDER — PEG-KCL-NACL-NASULF-NA ASC-C 100 G PO SOLR: 1.0000 | Freq: Once | ORAL | Status: DC

## 2013-01-17 NOTE — Telephone Encounter (Signed)
Patient needs movi prep 

## 2013-02-20 ENCOUNTER — Telehealth (INDEPENDENT_AMBULATORY_CARE_PROVIDER_SITE_OTHER): Payer: Self-pay | Admitting: *Deleted

## 2013-02-20 NOTE — Telephone Encounter (Signed)
  Procedure: tcs  Reason/Indication:  screening  Has patient had this procedure before?  no  If so, when, by whom and where?    Is there a family history of colon cancer?  no  Who?  What age when diagnosed?    Is patient diabetic?   no      Does patient have prosthetic heart valve?  no  Do you have a pacemaker?  no  Has patient ever had endocarditis? no  Has patient had joint replacement within last 12 months?  no  Does patient tend to be constipated or take laxatives? no  Is patient on Coumadin, Plavix and/or Aspirin? no  Medications: carbamazepine 200 mg bid, vit d supplement 1000 units daily, fish oil 1000 mg bid  Allergies: dilantin  Medication Adjustment:   Procedure date & time: 03/13/13 at 830

## 2013-02-21 NOTE — Telephone Encounter (Signed)
agree

## 2013-02-26 ENCOUNTER — Encounter (HOSPITAL_COMMUNITY): Payer: Self-pay | Admitting: Pharmacy Technician

## 2013-03-13 ENCOUNTER — Ambulatory Visit (HOSPITAL_COMMUNITY)
Admission: RE | Admit: 2013-03-13 | Discharge: 2013-03-13 | Disposition: A | Discharge status: Home / Self Care | Payer: BC Managed Care – PPO | Source: Ambulatory Visit | Attending: Internal Medicine | Admitting: Internal Medicine

## 2013-03-13 ENCOUNTER — Encounter (HOSPITAL_COMMUNITY)
Admission: RE | Disposition: A | Discharge status: Home / Self Care | Payer: Self-pay | Source: Ambulatory Visit | Attending: Internal Medicine

## 2013-03-13 ENCOUNTER — Encounter (HOSPITAL_COMMUNITY): Payer: Self-pay | Admitting: *Deleted

## 2013-03-13 DIAGNOSIS — E785 Hyperlipidemia, unspecified: Secondary | ICD-10-CM | POA: Insufficient documentation

## 2013-03-13 DIAGNOSIS — Z1211 Encounter for screening for malignant neoplasm of colon: Secondary | ICD-10-CM | POA: Insufficient documentation

## 2013-03-13 HISTORY — DX: Adverse effect of unspecified anesthetic, initial encounter: T41.45XA

## 2013-03-13 HISTORY — PX: COLONOSCOPY: SHX5424

## 2013-03-13 HISTORY — DX: Other complications of anesthesia, initial encounter: T88.59XA

## 2013-03-13 SURGERY — COLONOSCOPY
Anesthesia: Moderate Sedation

## 2013-03-13 MED ORDER — MEPERIDINE HCL 50 MG/ML IJ SOLN
INTRAMUSCULAR | Status: DC | PRN
  Administered 2013-03-13 (×2): 25 mg via INTRAVENOUS

## 2013-03-13 MED ORDER — MEPERIDINE HCL 50 MG/ML IJ SOLN
INTRAMUSCULAR | Status: AC
  Filled 2013-03-13: qty 1

## 2013-03-13 MED ORDER — MIDAZOLAM HCL 5 MG/5ML IJ SOLN
INTRAMUSCULAR | Status: DC | PRN
  Administered 2013-03-13: 1 mg via INTRAVENOUS
  Administered 2013-03-13 (×2): 2 mg via INTRAVENOUS
  Administered 2013-03-13: 1 mg via INTRAVENOUS

## 2013-03-13 MED ORDER — STERILE WATER FOR IRRIGATION IR SOLN
Status: DC | PRN
  Administered 2013-03-13: 08:00:00

## 2013-03-13 MED ORDER — SODIUM CHLORIDE 0.9 % IV SOLN
INTRAVENOUS | Status: DC
  Administered 2013-03-13: 08:00:00 via INTRAVENOUS

## 2013-03-13 MED ORDER — MIDAZOLAM HCL 5 MG/5ML IJ SOLN
INTRAMUSCULAR | Status: AC
  Filled 2013-03-13: qty 10

## 2013-03-13 NOTE — Discharge Instructions (Signed)
Resume usual medications and diet. °No driving for 24 hours. °Next screening exam in 10 years. ° °Colonoscopy, Care After °Refer to this sheet in the next few weeks. These instructions provide you with information on caring for yourself after your procedure. Your health care provider may also give you more specific instructions. Your treatment has been planned according to current medical practices, but problems sometimes occur. Call your health care provider if you have any problems or questions after your procedure. °WHAT TO EXPECT AFTER THE PROCEDURE  °After your procedure, it is typical to have the following: °· A small amount of blood in your stool. °· Moderate amounts of gas and mild abdominal cramping or bloating. °HOME CARE INSTRUCTIONS °· Do not drive, operate machinery, or sign important documents for 24 hours. °· You may shower and resume your regular physical activities, but move at a slower pace for the first 24 hours. °· Take frequent rest periods for the first 24 hours. °· Walk around or put a warm pack on your abdomen to help reduce abdominal cramping and bloating. °· Drink enough fluids to keep your urine clear or pale yellow. °· You may resume your normal diet as instructed by your health care provider. Avoid heavy or fried foods that are hard to digest. °· Avoid drinking alcohol for 24 hours or as instructed by your health care provider. °· Only take over-the-counter or prescription medicines as directed by your health care provider. °· If a tissue sample (biopsy) was taken during your procedure: °¨ Do not take aspirin or blood thinners for 7 days, or as instructed by your health care provider. °¨ Do not drink alcohol for 7 days, or as instructed by your health care provider. °¨ Eat soft foods for the first 24 hours. °SEEK MEDICAL CARE IF: °You have persistent spotting of blood in your stool 2-3 days after the procedure. °SEEK IMMEDIATE MEDICAL CARE IF: °· You have more than a small spotting of  blood in your stool. °· You pass large blood clots in your stool. °· Your abdomen is swollen (distended). °· You have nausea or vomiting. °· You have a fever. °· You have increasing abdominal pain that is not relieved with medicine. ° °

## 2013-03-13 NOTE — H&P (Signed)
Suzanne Jimenez is an 53 y.o. female.   Chief Complaint: Patient is here for colonoscopy. HPI: Patient is 53 year old African female who is here for screening colonoscopy. She denies abdominal pain change in bowel habits or rectal bleeding. Family history is negative for CRC.  Past Medical History  Diagnosis Date  . Seizures 1990  . Asthma   . Anemia   . Hyperlipidemia   . Abnormal Pap smear 1999  . Complication of anesthesia     Takes a while to wake up    Past Surgical History  Procedure Laterality Date  . Tubal ligation    . Brain surgery  1990  . Cesarean section  2004  . Gynecologic cryosurgery  1999    Abnormal Pap    Family History  Problem Relation Age of Onset  . Diabetes Mother   . Hypertension Mother   . Diabetes Father   . Heart disease Father   . Hypertension Father   . Heart disease Brother   . Colon cancer Neg Hx    Social History:  reports that she has never smoked. She has never used smokeless tobacco. She reports that she does not drink alcohol or use illicit drugs.  Allergies:  Allergies  Allergen Reactions  . Dilantin [Phenytoin] Swelling    Swelling of lymph nodes    Medications Prior to Admission  Medication Sig Dispense Refill  . carbamazepine (TEGRETOL) 200 MG tablet Take 1 tablet (200 mg total) by mouth 2 (two) times daily.  60 tablet  11  . cholecalciferol (VITAMIN D) 1000 UNITS tablet Take 1,000 Units by mouth daily.      . ferrous sulfate 325 (65 FE) MG tablet Take 325 mg by mouth daily as needed (for defficiency).      . fish oil-omega-3 fatty acids 1000 MG capsule Take 2 g by mouth daily.      . peg 3350 powder (MOVIPREP) 100 G SOLR Take 1 kit (200 g total) by mouth once.  1 kit  0    No results found for this or any previous visit (from the past 48 hour(s)). No results found.  ROS  Blood pressure 126/66, pulse 76, temperature 98.3 F (36.8 C), temperature source Oral, resp. rate 21, height '5\' 2"'  (1.575 m), weight 160 lb  (72.576 kg), last menstrual period 08/12/2012, SpO2 100.00%. Physical Exam  Constitutional: She appears well-developed and well-nourished.  HENT:  Mouth/Throat: Oropharynx is clear and moist.  Eyes: Conjunctivae are normal. No scleral icterus.  Neck: No thyromegaly present.  Cardiovascular: Normal rate, regular rhythm and normal heart sounds.   No murmur heard. Respiratory: Effort normal and breath sounds normal.  GI: Soft. She exhibits no distension and no mass. There is no tenderness.  Musculoskeletal: She exhibits no edema.  Lymphadenopathy:    She has no cervical adenopathy.  Neurological: She is alert.  Skin: Skin is warm and dry.     Assessment/Plan Average risk screening colonoscopy.  Suzanne Jimenez 03/13/2013, 8:10 AM

## 2013-03-13 NOTE — Op Note (Signed)
COLONOSCOPY PROCEDURE REPORT  PATIENT:  Suzanne Jimenez  MR#:  [id number] Birthdate:  03/23/1960, 53 y.o., female Endoscopist:  Dr. Malissa HippoNajeeb U. Keyasia Jolliff, MD Referred By:  Dr.  Rudi Heaponald Moore, MD  Procedure:   Colonoscopy  Indications:  Patient is 53 year old African female who is undergoing average risk screening colonoscopy.  Informed Consent:  The procedure and risks were reviewed with the patient and informed consent was obtained.  Medications:  Demerol 50 mg IV Versed 6 mg IV  Description of procedure:  After a digital rectal exam was performed, that colonoscope was advanced from the anus through the rectum and colon to the area of the cecum, ileocecal valve and appendiceal orifice. The cecum was deeply intubated. These structures were well-seen and photographed for the record. From the level of the cecum and ileocecal valve, the scope was slowly and cautiously withdrawn. The mucosal surfaces were carefully surveyed utilizing scope tip to flexion to facilitate fold flattening as needed. The scope was pulled down into the rectum where a thorough exam including retroflexion was performed.  Findings:   Prep excellent. Normal mucosa of cecum, ascending colon, hepatic flexure transverse colon splenic flexure, descending and sigmoid colon. Normal rectal mucosa and anal rectal junction.   Therapeutic/Diagnostic Maneuvers Performed:   None  Complications:  None  Cecal Withdrawal Time:  7 minutes  Impression:  Normal colonoscopy  Recommendations:  Standard instructions given. Next screening exam in 10 years.  Tayleigh Wetherell U  03/13/2013 8:38 AM  CC: Dr. Rudi HeapMOORE, DONALD, MD & Dr. Bonnetta BarryNo ref. provider found

## 2013-03-15 ENCOUNTER — Encounter (HOSPITAL_COMMUNITY): Payer: Self-pay | Admitting: Internal Medicine

## 2013-05-07 ENCOUNTER — Ambulatory Visit (INDEPENDENT_AMBULATORY_CARE_PROVIDER_SITE_OTHER): Payer: BC Managed Care – PPO | Admitting: Adult Health

## 2013-05-07 ENCOUNTER — Encounter: Payer: Self-pay | Admitting: Adult Health

## 2013-05-07 VITALS — BP 120/80 | Ht 62.0 in | Wt 166.0 lb

## 2013-05-07 DIAGNOSIS — B9689 Other specified bacterial agents as the cause of diseases classified elsewhere: Secondary | ICD-10-CM

## 2013-05-07 DIAGNOSIS — A499 Bacterial infection, unspecified: Secondary | ICD-10-CM

## 2013-05-07 DIAGNOSIS — N760 Acute vaginitis: Secondary | ICD-10-CM

## 2013-05-07 DIAGNOSIS — N898 Other specified noninflammatory disorders of vagina: Secondary | ICD-10-CM

## 2013-05-07 HISTORY — DX: Other specified bacterial agents as the cause of diseases classified elsewhere: B96.89

## 2013-05-07 HISTORY — DX: Other specified noninflammatory disorders of vagina: N89.8

## 2013-05-07 LAB — POCT WET PREP (WET MOUNT): WBC WET PREP: POSITIVE

## 2013-05-07 MED ORDER — METRONIDAZOLE 500 MG PO TABS: 500.0000 mg | ORAL_TABLET | Freq: Two times a day (BID) | ORAL | Status: DC

## 2013-05-07 NOTE — Patient Instructions (Signed)
Bacterial Vaginosis Bacterial vaginosis is a vaginal infection that occurs when the normal balance of bacteria in the vagina is disrupted. It results from an overgrowth of certain bacteria. This is the most common vaginal infection in women of childbearing age. Treatment is important to prevent complications, especially in pregnant women, as it can cause a premature delivery. CAUSES  Bacterial vaginosis is caused by an increase in harmful bacteria that are normally present in smaller amounts in the vagina. Several different kinds of bacteria can cause bacterial vaginosis. However, the reason that the condition develops is not fully understood. RISK FACTORS Certain activities or behaviors can put you at an increased risk of developing bacterial vaginosis, including:  Having a new sex partner or multiple sex partners.  Douching.  Using an intrauterine device (IUD) for contraception. Women do not get bacterial vaginosis from toilet seats, bedding, swimming pools, or contact with objects around them. SIGNS AND SYMPTOMS  Some women with bacterial vaginosis have no signs or symptoms. Common symptoms include:  Grey vaginal discharge.  A fishlike odor with discharge, especially after sexual intercourse.  Itching or burning of the vagina and vulva.  Burning or pain with urination. DIAGNOSIS  Your health care provider will take a medical history and examine the vagina for signs of bacterial vaginosis. A sample of vaginal fluid may be taken. Your health care provider will look at this sample under a microscope to check for bacteria and abnormal cells. A vaginal pH test may also be done.  TREATMENT  Bacterial vaginosis may be treated with antibiotic medicines. These may be given in the form of a pill or a vaginal cream. A second round of antibiotics may be prescribed if the condition comes back after treatment.  HOME CARE INSTRUCTIONS   Only take over-the-counter or prescription medicines as  directed by your health care provider.  If antibiotic medicine was prescribed, take it as directed. Make sure you finish it even if you start to feel better.  Do not have sex until treatment is completed.  Tell all sexual partners that you have a vaginal infection. They should see their health care provider and be treated if they have problems, such as a mild rash or itching.  Practice safe sex by using condoms and only having one sex partner. SEEK MEDICAL CARE IF:   Your symptoms are not improving after 3 days of treatment.  You have increased discharge or pain.  You have a fever. MAKE SURE YOU:   Understand these instructions.  Will watch your condition.  Will get help right away if you are not doing well or get worse. FOR MORE INFORMATION  Centers for Disease Control and Prevention, Division of STD Prevention: SolutionApps.co.zawww.cdc.gov/std American Sexual Health Association (ASHA): www.ashastd.org  Document Released: 12/27/2004 Document Revised: 10/17/2012 Document Reviewed: 08/08/2012 [facility] Patient Information 2014 [place]Care, [place]. Take flagyl No sex x 1 week No alcohol Try REPHRESH or LUVENA  OTC

## 2013-05-07 NOTE — Progress Notes (Signed)
Subjective:     Patient ID: Suzanne DublinPatricia Z Jimenez, female   DOB: 04-Jun-1960, 53 y.o.   MRN: [id number]  HPI Suzanne Jimenez is a 53 year old black female in complaining of vaginal discharge with sour smell, has had BV in past and she has douched.Periods are irregular had one in December then 4/11, has some hot flashes, no pain with sex.  Review of Systems See HPI Reviewed past medical,surgical, social and family history. Reviewed medications and allergies.     Objective:   Physical Exam BP 120/80  Ht 5\' 2"  (1.575 m)  Wt 166 lb (75.297 kg)  BMI 30.35 kg/m2  LMP 08/03/2014LMP4/11/15.   Skin warm and dry.Pelvic: external genitalia is normal in appearance, vagina: white discharge with odor, cervix:smooth and bulbous, uterus: normal size, shape and contour, non tender, no masses felt, adnexa: no masses or tenderness noted. Wet prep: + for clue cells and +WBCs. Discussed no tub baths,thongs or douching.  Assessment:     Vaginal discharge BV    Plan:     Rx flagyl 500 mg 1 bid x 7 days, no alcohol, review handout on BV   NO sex x 1 week Follow up prn Try rephresh or luvena

## 2013-10-09 ENCOUNTER — Ambulatory Visit (INDEPENDENT_AMBULATORY_CARE_PROVIDER_SITE_OTHER): Payer: BC Managed Care – PPO

## 2013-10-09 DIAGNOSIS — Z23 Encounter for immunization: Secondary | ICD-10-CM

## 2013-10-24 ENCOUNTER — Ambulatory Visit (INDEPENDENT_AMBULATORY_CARE_PROVIDER_SITE_OTHER): Payer: BC Managed Care – PPO | Admitting: Nurse Practitioner

## 2013-10-24 ENCOUNTER — Encounter: Payer: Self-pay | Admitting: Nurse Practitioner

## 2013-10-24 VITALS — BP 112/78 | HR 81 | Ht 62.0 in | Wt 160.0 lb

## 2013-10-24 DIAGNOSIS — G40109 Localization-related (focal) (partial) symptomatic epilepsy and epileptic syndromes with simple partial seizures, not intractable, without status epilepticus: Secondary | ICD-10-CM

## 2013-10-24 MED ORDER — CARBAMAZEPINE 200 MG PO TABS: 200.0000 mg | ORAL_TABLET | Freq: Two times a day (BID) | ORAL | Status: DC

## 2013-10-24 NOTE — Progress Notes (Signed)
[facility]  PATIENT: Suzanne Jimenez DOB: 12-Jan-1960   REASON FOR VISIT: Followup for seizure disorder   HISTORY OF PRESENT ILLNESS:Suzanne Jimenez, 53 -year-old female returns today for followup. 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 She had surgical removal of the AV malformation in the left and seizures have been under good control since that time. She is currently on Tegretol 200mg   twice daily. She denies any side effects to the medication, no daytime drowsiness, no feelings of being off balance. She denies missing any doses. She denies any headache. She has had no falls. Appetite is good, sleeping well. No new neurologic complaints. Recent CBZ level was 11.4     REVIEW OF SYSTEMS: Full 14 system review of systems performed and notable only for those listed, all others are neg:  Constitutional: N/A  Cardiovascular: N/A  Ear/Nose/Throat: N/A  Skin: N/A  Eyes: N/A  Respiratory: N/A  Gastroitestinal: N/A  Hematology/Lymphatic: N/A  Endocrine: N/A Musculoskeletal:N/A  Allergy/Immunology: N/A  Neurological: N/A Psychiatric: N/A Sleep : NA   ALLERGIES: Allergies  Allergen Reactions  . Dilantin [Phenytoin] Swelling    Swelling of lymph nodes    HOME MEDICATIONS: Outpatient Prescriptions Prior to Visit  Medication Sig Dispense Refill  . carbamazepine (TEGRETOL) 200 MG tablet Take 1 tablet (200 mg total) by mouth 2 (two) times daily.  60 tablet  11  . cholecalciferol (VITAMIN D) 1000 UNITS tablet Take 1,000 Units by mouth daily.      . ferrous sulfate 325 (65 FE) MG tablet Take 325 mg by mouth daily as needed (for defficiency).      . fish oil-omega-3 fatty acids 1000 MG capsule Take 2 g by mouth daily.      . metroNIDAZOLE (FLAGYL) 500 MG tablet Take 1 tablet (500 mg total) by mouth 2 (two) times daily.  14 tablet  1   No facility-administered medications prior to visit.    PAST MEDICAL HISTORY: Past Medical History    Diagnosis Date  . Seizures 1990  . Asthma   . Anemia   . Hyperlipidemia   . Abnormal Pap smear 1999  . Complication of anesthesia     Takes a while to wake up  . Vaginal Pap smear, abnormal   . Vaginal discharge 05/07/2013  . BV (bacterial vaginosis) 05/07/2013    PAST SURGICAL HISTORY: Past Surgical History  Procedure Laterality Date  . Tubal ligation    . Brain surgery  1990  . Cesarean section  2004  . Gynecologic cryosurgery  1999    Abnormal Pap  . Colonoscopy N/A 03/13/2013    Procedure: COLONOSCOPY;  Surgeon: Malissa HippoNajeeb U Rehman, MD;  Location: [facility];  Service: Endoscopy;  Laterality: N/A;  830    FAMILY HISTORY: Family History  Problem Relation Age of Onset  . Diabetes Mother   . Hypertension Mother   . Diabetes Father   . Heart disease Father   . Hypertension Father   . Heart disease Brother   . Colon cancer Neg Hx   . Diabetes Maternal Aunt   . Hypertension Maternal Aunt   . Diabetes Maternal Uncle   . Hypertension Maternal Uncle     SOCIAL HISTORY: History   Social History  . Marital Status: Married    Spouse Name: N/A    Number of Children: 2  . Years of Education: college4   Occupational History  . 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 Smoking status: Never Smoker   . Smokeless tobacco: Never Used  . Alcohol Use: No  . Drug Use: No  . Sexual Activity: Yes    Partners: Male    Birth Control/ Protection: Surgical, Post-menopausal   Other Topics Concern  . Not on file   Social History Narrative  . No narrative on file     PHYSICAL EXAM  Filed Vitals:   10/24/13 1355  BP: 112/78  Pulse: 81  Height: 5\' 2"  (1.575 m)  Weight: 160 lb (72.576 kg)   Body mass index is 29.26 kg/(m^2). Generalized: Well developed, in no acute distress  Head: normocephalic and atraumatic,. Oropharynx benign  Neck: Supple, no carotid bruits  Cardiac: Regular rate rhythm, no murmur  Musculoskeletal: No deformity   Neurological examination  Mentation: Alert oriented to time, place, history taking. Follows all commands speech and language fluent  Cranial nerve II-XII: Pupils were equal round reactive to light extraocular movements were full, visual field were full on confrontational test. Facial sensation and strength were normal. hearing was intact to finger rubbing bilaterally. Uvula tongue midline. head turning and shoulder shrug and were normal and symmetric.Tongue protrusion into cheek strength was normal.  Motor: normal bulk and tone, full strength in the BUE, BLE, fine finger movements normal, no pronator drift. No focal weakness  Coordination: finger-nose-finger, heel-to-shin bilaterally, no dysmetria  Reflexes: Brachioradialis 2/2, biceps 2/2, triceps 2/2, patellar 2/2, Achilles 2/2, plantar responses were flexor bilaterally.  Gait and Station: Rising up from seated position without assistance, normal stance, moderate stride, good arm swing, smooth turning, tandem gait steady  DIAGNOSTIC DATA (LABS, IMAGING, TESTING) - I reviewed patient records, labs, notes, testing and imaging myself where available.  Lab Results  Component Value Date   WBC 2.7* 01/08/2013   HGB 11.0* 01/08/2013   HCT 33.5* 01/08/2013   MCV 85 01/08/2013   PLT 225 01/08/2013      Component Value Date/Time   NA 140 01/08/2013 1132   K 3.2* 01/08/2013 1132   CL 100 01/08/2013 1132   CO2 24 01/08/2013 1132   GLUCOSE 119* 01/08/2013 1132   BUN 9 01/08/2013 1132   CREATININE 0.85 01/08/2013 1132   CALCIUM 8.6* 01/08/2013 1132   PROT 6.3 01/08/2013 1132   AST 28 01/08/2013 1132   ALT 20 01/08/2013 1132   ALKPHOS 69 01/08/2013 1132   BILITOT <0.2 01/08/2013 1132   GFRNONAA 80 01/08/2013 1132   GFRAA 92 01/08/2013 1132    Lab Results  Component Value Date   VITAMINB12 443 06/22/2012   Lab Results  Component Value Date   TSH 0.956 01/08/2013      ASSESSMENT AND PLAN  53 y.o. year old female  has a past  medical history of Seizures (1990);  here to followup. No seizure activity in many years  Continue carbamazepine 200 mg twice daily Obtain CBC, CMP and fax to [phone] Reviewed carbamazepine level from 09/11/2013, (11.4) Call for any seizure activity Followup yearly and when necessary Nilda RiggsNancy Carolyn Panhia Karl, [facility] JerseyGNP, [facility]BC, APRN  [facility] [address], [address] [place], [place] [address] [phone]

## 2013-10-24 NOTE — Progress Notes (Signed)
I agree with the assessment and plan as directed by NP .The patient is known to me .   Verta Riedlinger, MD  

## 2013-10-24 NOTE — Patient Instructions (Addendum)
Continue carbamazepine 200 mg twice daily Obtain CBC, CMP and fax to [phone] Reviewed carbamazepine level from 09/11/2013, 11.4 Call for any seizure activity Followup yearly and when necessary

## 2013-11-11 ENCOUNTER — Encounter: Payer: Self-pay | Admitting: Nurse Practitioner

## 2013-12-25 ENCOUNTER — Other Ambulatory Visit: Payer: Self-pay | Admitting: Adult Health

## 2014-01-16 ENCOUNTER — Encounter: Payer: Self-pay | Admitting: Obstetrics & Gynecology

## 2014-01-16 ENCOUNTER — Ambulatory Visit (INDEPENDENT_AMBULATORY_CARE_PROVIDER_SITE_OTHER): Payer: BLUE CROSS/BLUE SHIELD | Admitting: Obstetrics & Gynecology

## 2014-01-16 ENCOUNTER — Ambulatory Visit: Payer: Self-pay | Admitting: Obstetrics & Gynecology

## 2014-01-16 VITALS — BP 120/70 | Ht 62.0 in | Wt 168.0 lb

## 2014-01-16 DIAGNOSIS — B9689 Other specified bacterial agents as the cause of diseases classified elsewhere: Secondary | ICD-10-CM

## 2014-01-16 DIAGNOSIS — N760 Acute vaginitis: Secondary | ICD-10-CM

## 2014-01-16 DIAGNOSIS — A499 Bacterial infection, unspecified: Secondary | ICD-10-CM

## 2014-01-16 DIAGNOSIS — N924 Excessive bleeding in the premenopausal period: Secondary | ICD-10-CM

## 2014-01-16 MED ORDER — MEGESTROL ACETATE 40 MG PO TABS: ORAL_TABLET | ORAL | Status: DC

## 2014-01-16 MED ORDER — METRONIDAZOLE 0.75 % VA GEL: VAGINAL | Status: DC

## 2014-01-16 NOTE — Progress Notes (Signed)
Patient ID: Suzanne Jimenez, female   DOB: [birth year], 54 y.o.   MRN: [id number] Chief Complaint  Patient presents with  . gyn visit    vaginal discharge/ odor.    HPI 4 day history of vaginal discharge Had last April thinks it is BV Also having intermenstrual spotting bleeding  ROS No burning with urination, frequency or urgency No nausea, vomiting or diarrhea Nor fever chills or other constitutional symptoms   Blood pressure 120/70, height 5\' 2"  (1.575 m), weight 168 lb (76.204 kg), last menstrual period 12/29/2013.  EXAM Abdomen:      soft Vulva:            normal appearing vulva with no masses, tenderness or lesions Vagina:          normal mucosa, thin grey discharge Cervix:           normal appearance Uterus:          uterus is normal size, shape, consistency and nontender Adnexa:          Rectal:            Hemoccult:                               Assessment/Plan:  Perimenopausal menometrorrhgia  BV   Megace, metro gel

## 2014-02-12 ENCOUNTER — Encounter: Payer: Self-pay | Admitting: Obstetrics & Gynecology

## 2014-02-12 ENCOUNTER — Ambulatory Visit (INDEPENDENT_AMBULATORY_CARE_PROVIDER_SITE_OTHER): Payer: BLUE CROSS/BLUE SHIELD | Admitting: Obstetrics & Gynecology

## 2014-02-12 VITALS — BP 100/70 | Wt 169.0 lb

## 2014-02-12 DIAGNOSIS — N921 Excessive and frequent menstruation with irregular cycle: Secondary | ICD-10-CM

## 2014-02-12 MED ORDER — MEGESTROL ACETATE 40 MG PO TABS: ORAL_TABLET | ORAL | Status: DC

## 2014-02-12 NOTE — Progress Notes (Signed)
Patient ID: Suzanne Jimenez, female   DOB: 05/26/1960, 54 y.o.   MRN: [id number] Pt continues to bleed on megestrol Stopped on the 3 then 2 a day then when went down to 1 per day bleeding heavy again, heavy, clots, crampy  Go up to 3 per day, come back in for sonogram and schedule hysteroscopy uterine curettage and Nova Sure ablation  02/28/2014  Preoperative History and Physical  Suzanne Jimenez is a 54 y.o. [id number] with Patient's last menstrual period was 12/29/2013. admitted for a hysteroscopy uterine curettage endometrial ablation using NovaSure.  Unresponsive to megestrol  PMH:    Past Medical History  Diagnosis Date  . Seizures 1990  . Asthma   . Anemia   . Hyperlipidemia   . Abnormal Pap smear 1999  . Complication of anesthesia     Takes a while to wake up  . Vaginal Pap smear, abnormal   . Vaginal discharge 05/07/2013  . BV (bacterial vaginosis) 05/07/2013    PSH:     Past Surgical History  Procedure Laterality Date  . Tubal ligation    . Brain surgery  1990  . Cesarean section  2004  . Gynecologic cryosurgery  1999    Abnormal Pap  . Colonoscopy N/A 03/13/2013    Procedure: COLONOSCOPY;  Surgeon: Malissa HippoNajeeb U Rehman, MD;  Location: [facility];  Service: Endoscopy;  Laterality: N/A;  830    POb/GynH:      OB History    Gravida Para Term Preterm AB TAB SAB Ectopic Multiple Living   3 2 2  0 1 1 0 0 0 2      SH:   History  Substance Use Topics  . Smoking status: Never Smoker   . Smokeless tobacco: Never Used  . Alcohol Use: No    FH:    Family History  Problem Relation Age of Onset  . Diabetes Mother   . Hypertension Mother   . Diabetes Father   . Heart disease Father   . Hypertension Father   . Heart disease Brother   . Colon cancer Neg Hx   . Diabetes Maternal Aunt   . Hypertension Maternal Aunt   . Diabetes Maternal Uncle   . Hypertension Maternal Uncle      Allergies:  Allergies  Allergen Reactions  . Dilantin [Phenytoin] Swelling     Swelling of lymph nodes    Medications:       Current outpatient prescriptions:  .  carbamazepine (TEGRETOL) 200 MG tablet, Take 1 tablet (200 mg total) by mouth 2 (two) times daily., Disp: 60 tablet, Rfl: 11 .  cholecalciferol (VITAMIN D) 1000 UNITS tablet, Take 1,000 Units by mouth daily., Disp: , Rfl:  .  ferrous sulfate 325 (65 FE) MG tablet, Take 325 mg by mouth daily as needed (for defficiency)., Disp: , Rfl:  .  fish oil-omega-3 fatty acids 1000 MG capsule, Take 2 g by mouth daily., Disp: , Rfl:  .  megestrol (MEGACE) 40 MG tablet, 3 tablets a day, Disp: 90 tablet, Rfl: 3 .  metroNIDAZOLE (FLAGYL) 500 MG tablet, TAKE ONE TABLET BY MOUTH TWICE DAILY (Patient not taking: Reported on 01/16/2014), Disp: 14 tablet, Rfl: 0 .  metroNIDAZOLE (METROGEL VAGINAL) 0.75 % vaginal gel, Nightly x 5 nights (Patient not taking: Reported on 02/12/2014), Disp: 70 g, Rfl: 0  Review of Systems:   Review of Systems  Constitutional: Negative for fever, chills, weight loss, malaise/fatigue and diaphoresis.  HENT: Negative for hearing loss, ear pain,  nosebleeds, congestion, sore throat, neck pain, tinnitus and ear discharge.   Eyes: Negative for blurred vision, double vision, photophobia, pain, discharge and redness.  Respiratory: Negative for cough, hemoptysis, sputum production, shortness of breath, wheezing and stridor.   Cardiovascular: Negative for chest pain, palpitations, orthopnea, claudication, leg swelling and PND.  Gastrointestinal: Positive for abdominal pain. Negative for heartburn, nausea, vomiting, diarrhea, constipation, blood in stool and melena.  Genitourinary: Negative for dysuria, urgency, frequency, hematuria and flank pain.  Musculoskeletal: Negative for myalgias, back pain, joint pain and falls.  Skin: Negative for itching and rash.  Neurological: Negative for dizziness, tingling, tremors, sensory change, speech change, focal weakness, seizures, loss of consciousness, weakness and  headaches.  Endo/Heme/Allergies: Negative for environmental allergies and polydipsia. Does not bruise/bleed easily.  Psychiatric/Behavioral: Negative for depression, suicidal ideas, hallucinations, memory loss and substance abuse. The patient is not nervous/anxious and does not have insomnia.      PHYSICAL EXAM:  Blood pressure 100/70, weight 169 lb (76.658 kg), last menstrual period 12/29/2013.    Vitals reviewed. Constitutional: She is oriented to person, place, and time. She appears well-developed and well-nourished.  HENT:  Head: Normocephalic and atraumatic.  Right Ear: External ear normal.  Left Ear: External ear normal.  Nose: Nose normal.  Mouth/Throat: Oropharynx is clear and moist.  Eyes: Conjunctivae and EOM are normal. Pupils are equal, round, and reactive to light. Right eye exhibits no discharge. Left eye exhibits no discharge. No scleral icterus.  Neck: Normal range of motion. Neck supple. No tracheal deviation present. No thyromegaly present.  Cardiovascular: Normal rate, regular rhythm, normal heart sounds and intact distal pulses.  Exam reveals no gallop and no friction rub.   No murmur heard. Respiratory: Effort normal and breath sounds normal. No respiratory distress. She has no wheezes. She has no rales. She exhibits no tenderness.  GI: Soft. Bowel sounds are normal. She exhibits no distension and no mass. There is tenderness. There is no rebound and no guarding.  Genitourinary:       Vulva is normal without lesions Vagina is pink moist without discharge Cervix normal in appearance and pap is normal Uterus is normal Adnexa is negative with normal sized ovaries by sonogram  Musculoskeletal: Normal range of motion. She exhibits no edema and no tenderness.  Neurological: She is alert and oriented to person, place, and time. She has normal reflexes. She displays normal reflexes. No cranial nerve deficit. She exhibits normal muscle tone. Coordination normal.  Skin:  Skin is warm and dry. No rash noted. No erythema. No pallor.  Psychiatric: She has a normal mood and affect. Her behavior is normal. Judgment and thought content normal.    Labs: No results found for this or any previous visit (from the past 336 hour(s)).  EKG: No orders found for this or any previous visit.  Imaging Studies: No results found.    Assessment: Menometrorrhagia unresponsive to megestrol Normal endometrial biopsy 07/2012 Patient Active Problem List   Diagnosis Date Noted  . Vaginal discharge 05/07/2013  . BV (bacterial vaginosis) 05/07/2013  . 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 DUB (dysfunctional uterine bleeding) 07/03/2012    Plan: hysterosocpy uterine curettage NovaSure  [facility] 02/12/2014 2:25 PM         .

## 2014-02-17 ENCOUNTER — Ambulatory Visit: Payer: BLUE CROSS/BLUE SHIELD | Admitting: Obstetrics & Gynecology

## 2014-02-21 ENCOUNTER — Telehealth: Payer: Self-pay | Admitting: Obstetrics & Gynecology

## 2014-02-21 NOTE — Telephone Encounter (Signed)
Pt aware to continue Megace  3 a day and follow up with Dr. Despina HiddenEure in 6 weeks. Pt verbalized understanding and call transferred to front staff for follow up appt to be scheduled.

## 2014-02-21 NOTE — Telephone Encounter (Signed)
Pt states she does not want to have the surgery for ablation due to out of pocket cost of $1900.00 per hospital. She states can this be managed with medication?   Pt states wants to go ahead and cancel surgery for 02/28/2014.

## 2014-02-21 NOTE — Telephone Encounter (Signed)
She can stay on the 3 megace a day and come back to office in 6 weeks and see how things are going

## 2014-02-21 NOTE — Patient Instructions (Signed)
Suzanne Sealsatricia Z Aymond  02/21/2014   Your procedure is scheduled on:  02/28/2014  Report to [facility] at  1100  AM.  Call this number if you have problems the morning of surgery: [phone]   Remember:   Do not eat food or drink liquids after midnight.   Take these medicines the morning of surgery with A SIP OF WATER:  tegretol   Do not wear jewelry, make-up or nail polish.  Do not wear lotions, powders, or perfumes.   Do not shave 48 hours prior to surgery. Men may shave face and neck.  Do not bring valuables to the hospital.  [facility] belongings or valuables.               Contacts, dentures or bridgework may not be worn into surgery.  Leave suitcase in the car. After surgery it may be brought to your room.  For patients admitted to the hospital, discharge time is determined by your treatment team.               Patients discharged the day of surgery will not be allowed to drive home.  Name and phone number of your driver: family  Special Instructions: Shower using CHG 2 nights before surgery and the night before surgery.  If you shower the day of surgery use CHG.  Use special wash - you have one bottle of CHG for all showers.  You should use approximately 1/3 of the bottle for each shower.   Please read over the following fact sheets that you were given: Pain Booklet, Coughing and Deep Breathing, Surgical Site Infection Prevention, Anesthesia Post-op Instructions and Care and Recovery After Surgery Endometrial Ablation Endometrial ablation removes the lining of the uterus (endometrium). It is usually a same-day, outpatient treatment. Ablation helps avoid major surgery, such as surgery to remove the cervix and uterus (hysterectomy). After endometrial ablation, you will have little or no menstrual bleeding and may not be able to have children. However, if you are premenopausal, you will need to use a reliable method of birth control following  the procedure because of the small chance that pregnancy can occur. There are different reasons to have this procedure, which include:  Heavy periods.  Bleeding that is causing anemia.  Irregular bleeding.  Bleeding fibroids on the lining inside the uterus if they are smaller than 3 centimeters. This procedure should not be done if:  You want children in the future.  You have severe cramps with your menstrual period.  You have precancerous or cancerous cells in your uterus.  You were recently pregnant.  You have gone through menopause.  You have had major surgery on the uterus, such as a cesarean delivery. [facility] KNOW ABOUT:  Any allergies you have.  All medicines you are taking, including vitamins, herbs, eye drops, creams, and over-the-counter medicines.  Previous problems you or members of your family have had with the use of anesthetics.  Any blood disorders you have.  Previous surgeries you have had.  Medical conditions you have. RISKS AND COMPLICATIONS  Generally, this is a safe procedure. However, as with any procedure, complications can occur. Possible complications include:  Perforation of the uterus.  Bleeding.  Infection of the uterus, bladder, or vagina.  Injury to surrounding organs.  An air bubble to the lung (air embolus).  Pregnancy following the procedure.  Failure of the procedure to  help the problem, requiring hysterectomy.  Decreased ability to diagnose cancer in the lining of the uterus. BEFORE THE PROCEDURE  The lining of the uterus must be tested to make sure there is no pre-cancerous or cancer cells present.  An ultrasound may be performed to look at the size of the uterus and to check for abnormalities.  Medicines may be given to thin the lining of the uterus. PROCEDURE  During the procedure, your health care provider will use a tool called a resectoscope to help see inside your uterus. There are different  ways to remove the lining of your uterus.   Radiofrequency - This method uses a radiofrequency-alternating electric current to remove the lining of the uterus.  Cryotherapy - This method uses extreme cold to freeze the lining of the uterus.  Heated-Free Liquid - This method uses heated salt (saline) solution to remove the lining of the uterus.  Microwave - This method uses high-energy microwaves to heat up the lining of the uterus to remove it.  Thermal balloon - This method involves inserting a catheter with a balloon tip into the uterus. The balloon tip is filled with heated fluid to remove the lining of the uterus. AFTER THE PROCEDURE  After your procedure, do not have sexual intercourse or insert anything into your vagina until permitted by your health care provider. After the procedure, you may experience:  Cramps.  Vaginal discharge.  Frequent urination. Document Released: 11/06/2003 Document Revised: 08/29/2012 Document Reviewed: 05/30/2012 [facility] Patient Information 2015 [place], [place]. This information is not intended to replace advice given to you by your health care provider. Make sure you discuss any questions you have with your health care provider. Hysteroscopy Hysteroscopy is a procedure used for looking inside the womb (uterus). It may be done for various reasons, including:  To evaluate abnormal bleeding, fibroid (benign, noncancerous) tumors, polyps, scar tissue (adhesions), and possibly cancer of the uterus.  To look for lumps (tumors) and other uterine growths.  To look for causes of why a woman cannot get pregnant (infertility), causes of recurrent loss of pregnancy (miscarriages), or a lost intrauterine device (IUD).  To perform a sterilization by blocking the fallopian tubes from inside the uterus. In this procedure, a thin, flexible tube with a tiny light and camera on the end of it (hysteroscope) is used to look inside the uterus. A hysteroscopy should be  done right after a menstrual period to be sure you are not pregnant. LET [facility] CARE PROVIDER KNOW ABOUT:   Any allergies you have.  All medicines you are taking, including vitamins, herbs, eye drops, creams, and over-the-counter medicines.  Previous problems you or members of your family have had with the use of anesthetics.  Any blood disorders you have.  Previous surgeries you have had.  Medical conditions you have. RISKS AND COMPLICATIONS  Generally, this is a safe procedure. However, as with any procedure, complications can occur. Possible complications include:  Putting a hole in the uterus.  Excessive bleeding.  Infection.  Damage to the cervix.  Injury to other organs.  Allergic reaction to medicines.  Too much fluid used in the uterus for the procedure. BEFORE THE PROCEDURE   Ask your health care provider about changing or stopping any regular medicines.  Do not take aspirin or blood thinners for 1 week before the procedure, or as directed by your health care provider. These can cause bleeding.  If you smoke, do not smoke for 2 weeks before the procedure.  In some cases, a medicine is placed in the cervix the day before the procedure. This medicine makes the cervix have a larger opening (dilate). This makes it easier for the instrument to be inserted into the uterus during the procedure.  Do not eat or drink anything for at least 8 hours before the surgery.  Arrange for someone to take you home after the procedure. PROCEDURE   You may be given a medicine to relax you (sedative). You may also be given one of the following:  A medicine that numbs the area around the cervix (local anesthetic).  A medicine that makes you sleep through the procedure (general anesthetic).  The hysteroscope is inserted through the vagina into the uterus. The camera on the hysteroscope sends a picture to a TV screen. This gives the surgeon a good view inside the  uterus.  During the procedure, air or a liquid is put into the uterus, which allows the surgeon to see better.  Sometimes, tissue is gently scraped from inside the uterus. These tissue samples are sent to a lab for testing. AFTER THE PROCEDURE   If you had a general anesthetic, you may be groggy for a couple hours after the procedure.  If you had a local anesthetic, you will be able to go home as soon as you are stable and feel ready.  You may have some cramping. This normally lasts for a couple days.  You may have bleeding, which varies from light spotting for a few days to menstrual-like bleeding for 3-7 days. This is normal.  If your test results are not back during the visit, make an appointment with your health care provider to find out the results. Document Released: 04/04/2000 Document Revised: 10/17/2012 Document Reviewed: 07/26/2012 [facility] Patient Information 2015 [place], [place]. This information is not intended to replace advice given to you by your health care provider. Make sure you discuss any questions you have with your health care provider. Dilation and Curettage or Vacuum Curettage Dilation and curettage (D&C) and vacuum curettage are minor procedures. A D&C involves stretching (dilation) the cervix and scraping (curettage) the inside lining of the womb (uterus). During a D&C, tissue is gently scraped from the inside lining of the uterus. During a vacuum curettage, the lining and tissue in the uterus are removed with the use of gentle suction.  Curettage may be performed to either diagnose or treat a problem. As a diagnostic procedure, curettage is performed to examine tissues from the uterus. A diagnostic curettage may be performed for the following symptoms:   Irregular bleeding in the uterus.   Bleeding with the development of clots.   Spotting between menstrual periods.   Prolonged menstrual periods.   Bleeding after menopause.   No menstrual period  (amenorrhea).   A change in size and shape of the uterus.  As a treatment procedure, curettage may be performed for the following reasons:   Removal of an IUD (intrauterine device).   Removal of retained placenta after giving birth. Retained placenta can cause an infection or bleeding severe enough to require transfusions.   Abortion.   Miscarriage.   Removal of polyps inside the uterus.   Removal of uncommon types of noncancerous lumps (fibroids).  LET [facility] CARE PROVIDER KNOW ABOUT:   Any allergies you have.   All medicines you are taking, including vitamins, herbs, eye drops, creams, and over-the-counter medicines.   Previous problems you or members of your family have had with the use of anesthetics.  Any blood disorders you have.   Previous surgeries you have had.   Medical conditions you have. RISKS AND COMPLICATIONS  Generally, this is a safe procedure. However, as with any procedure, complications can occur. Possible complications include:  Excessive bleeding.   Infection of the uterus.   Damage to the cervix.   Development of scar tissue (adhesions) inside the uterus, later causing abnormal amounts of menstrual bleeding.   Complications from the general anesthetic, if a general anesthetic is used.   Putting a hole (perforation) in the uterus. This is rare.  BEFORE THE PROCEDURE   Eat and drink before the procedure only as directed by your health care provider.   Arrange for someone to take you home.  PROCEDURE  This procedure usually takes about 15-30 minutes.  You will be given one of the following:  A medicine that numbs the area in and around the cervix (local anesthetic).   A medicine to make you sleep through the procedure (general anesthetic).  You will lie on your back with your legs in stirrups.   A warm metal or plastic instrument (speculum) will be placed in your vagina to keep it open and to allow the health  care provider to see the cervix.  There are two ways in which your cervix can be softened and dilated. These include:   Taking a medicine.   Having thin rods (laminaria) inserted into your cervix.   A curved tool (curette) will be used to scrape cells from the inside lining of the uterus. In some cases, gentle suction is applied with the curette. The curette will then be removed.  AFTER THE PROCEDURE   You will rest in the recovery area until you are stable and are ready to go home.   You may feel sick to your stomach (nauseous) or throw up (vomit) if you were given a general anesthetic.   You may have a sore throat if a tube was placed in your throat during general anesthesia.   You may have light cramping and bleeding. This may last for 2 days to 2 weeks after the procedure.   Your uterus needs to make a new lining after the procedure. This may make your next period late. Document Released: 12/27/2004 Document Revised: 08/29/2012 Document Reviewed: 07/26/2012 [facility] Patient Information 2015 [place], [place]. This information is not intended to replace advice given to you by your health care provider. Make sure you discuss any questions you have with your health care provider. PATIENT INSTRUCTIONS POST-ANESTHESIA  IMMEDIATELY FOLLOWING SURGERY:  Do not drive or operate machinery for the first twenty four hours after surgery.  Do not make any important decisions for twenty four hours after surgery or while taking narcotic pain medications or sedatives.  If you develop intractable nausea and vomiting or a severe headache please notify your doctor immediately.  FOLLOW-UP:  Please make an appointment with your surgeon as instructed. You do not need to follow up with anesthesia unless specifically instructed to do so.  WOUND CARE INSTRUCTIONS (if applicable):  Keep a dry clean dressing on the anesthesia/puncture wound site if there is drainage.  Once the wound has quit draining you  may leave it open to air.  Generally you should leave the bandage intact for twenty four hours unless there is drainage.  If the epidural site drains for more than 36-48 hours please call the anesthesia department.  QUESTIONS?:  Please feel free to call your physician or the hospital operator if you have  any questions, and they will be happy to assist you.

## 2014-02-24 ENCOUNTER — Encounter (HOSPITAL_COMMUNITY)
Admission: RE | Admit: 2014-02-24 | Discharge: 2014-02-24 | Disposition: A | Discharge status: Home / Self Care | Payer: BLUE CROSS/BLUE SHIELD | Source: Ambulatory Visit | Attending: Obstetrics & Gynecology | Admitting: Obstetrics & Gynecology

## 2014-02-28 ENCOUNTER — Ambulatory Visit (HOSPITAL_COMMUNITY)
Admission: RE | Admit: 2014-02-28 | Payer: BLUE CROSS/BLUE SHIELD | Source: Ambulatory Visit | Admitting: Obstetrics & Gynecology

## 2014-02-28 ENCOUNTER — Encounter (HOSPITAL_COMMUNITY): Admission: RE | Payer: Self-pay | Source: Ambulatory Visit

## 2014-02-28 SURGERY — DILATATION & CURETTAGE/HYSTEROSCOPY WITH NOVASURE ABLATION
Anesthesia: General

## 2014-03-05 ENCOUNTER — Encounter: Payer: BLUE CROSS/BLUE SHIELD | Admitting: Obstetrics & Gynecology

## 2014-04-03 ENCOUNTER — Encounter: Payer: Self-pay | Admitting: Obstetrics & Gynecology

## 2014-04-03 ENCOUNTER — Ambulatory Visit (INDEPENDENT_AMBULATORY_CARE_PROVIDER_SITE_OTHER): Payer: BLUE CROSS/BLUE SHIELD | Admitting: Obstetrics & Gynecology

## 2014-04-03 ENCOUNTER — Ambulatory Visit: Payer: BLUE CROSS/BLUE SHIELD | Admitting: Obstetrics & Gynecology

## 2014-04-03 VITALS — BP 118/70 | HR 72 | Wt 162.0 lb

## 2014-04-03 DIAGNOSIS — N921 Excessive and frequent menstruation with irregular cycle: Secondary | ICD-10-CM

## 2014-04-03 DIAGNOSIS — N924 Excessive bleeding in the premenopausal period: Secondary | ICD-10-CM

## 2014-04-03 NOTE — Progress Notes (Signed)
Patient ID: Suzanne Jimenez, female   DOB: [birth year], 54 y.o.   MRN: [id number] Chief Complaint  Patient presents with  . Follow-up    taking megace.    Blood pressure 118/70, pulse 72, weight 162 lb (73.483 kg).  Pt has been on megestrol algorithm for 6 weeks in lieu of endometrial ablation Currently taking just the 1 for about 3 weeks and not bleeding  Wants to continue on that for now  No exam  Perimenopausal AUB; stable on megestrol Pt understands the algorithm based dosing management  Answered questions regarding chronic megestrol     Face to face time:  15 minutes  Greater than 50% of the visit time was spent in counseling and coordination of care with the patient.  The summary and outline of the counseling and care coordination is summarized in the note above.   All questions were answered.

## 2014-08-19 ENCOUNTER — Other Ambulatory Visit: Payer: Self-pay | Admitting: Obstetrics & Gynecology

## 2014-08-21 ENCOUNTER — Encounter: Payer: Self-pay | Admitting: Family Medicine

## 2014-08-21 ENCOUNTER — Ambulatory Visit (INDEPENDENT_AMBULATORY_CARE_PROVIDER_SITE_OTHER): Payer: BLUE CROSS/BLUE SHIELD | Admitting: Family Medicine

## 2014-08-21 VITALS — BP 139/82 | HR 81 | Temp 98.1°F | Ht 62.0 in | Wt 166.0 lb

## 2014-08-21 DIAGNOSIS — L821 Other seborrheic keratosis: Secondary | ICD-10-CM | POA: Insufficient documentation

## 2014-08-21 DIAGNOSIS — R5383 Other fatigue: Secondary | ICD-10-CM | POA: Insufficient documentation

## 2014-08-21 DIAGNOSIS — I781 Nevus, non-neoplastic: Secondary | ICD-10-CM | POA: Diagnosis not present

## 2014-08-21 DIAGNOSIS — G40109 Localization-related (focal) (partial) symptomatic epilepsy and epileptic syndromes with simple partial seizures, not intractable, without status epilepticus: Secondary | ICD-10-CM | POA: Diagnosis not present

## 2014-08-21 DIAGNOSIS — R5382 Chronic fatigue, unspecified: Secondary | ICD-10-CM | POA: Diagnosis not present

## 2014-08-21 NOTE — Patient Instructions (Signed)
Great to meet you!  Come back to see me in a year unless you need [place] sooner.

## 2014-08-21 NOTE — Assessment & Plan Note (Signed)
Gen. fatigue No concern for mood disorder Checking thyroid, Tegretol level, CBC Recommended exercise

## 2014-08-21 NOTE — Progress Notes (Signed)
Patient ID: Suzanne Jimenez, female   DOB: 10/14/1960, 53 y.o.   MRN: [id number]   HPI  Patient presents today for follow-up chronic medical conditions, fatigue, and mole  Fatigue Patient explains to 4 month history of generalized fatigue. She denies any feelings of depression or anhedonia. She states that her periods are reasonably well controlled with Megace. She has a reported history of anemia, she stopped taking her iron but is still taking a multivitamin She sleeps well  Mole She has many seborrheic keratoses and were a few skin tags on her neck that she wants evaluated. None of them are irritated, bleeding, or changing. She is unsure if any of them are new.  Seizure disorder She sees [facility], she has not had a seizure in several years. She is tolerating Tegretol side effects  PMH: Smoking status noted ROS: Per HPI, otherwise negative  Objective: BP 139/82 mmHg  Pulse 81  Temp(Src) 98.1 F (36.7 C) (Oral)  Ht 5' 2" (1.575 m)  Wt 166 lb (75.297 kg)  BMI 30.35 kg/m2 Gen: NAD, alert, cooperative with exam HEENT: NCAT, TMs WNL BL, nares clear, oropharynx clear Neck: No thyromegaly, supple CV: RRR, good S1/S2, no murmur Resp: CTABL, no wheezes, non-labored Abd: SNTND, BS present, no guarding or organomegaly Ext: No edema, warm Neuro: Alert and oriented, No gross deficits, 2+ patellar tendon reflex bilaterally Skin: 50-100 seborrheic keratosis scattered around face, neck, and bilateral shoulders. 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 fatigue No concern for mood disorder Checking thyroid, Tegretol level, CBC Recommended exercise   Orders Placed This Encounter  Procedures  . Carbamazepine level, total  . Vit D  25 hydroxy (rtn osteoporosis monitoring)  . T4 AND TSH  . CBC with Differential  . CMP14+EGFR  . Lipid Panel    Sam Bradshaw, MD [facility] 08/21/2014, 1:39 PM    

## 2014-08-21 NOTE — Assessment & Plan Note (Signed)
She is well established with [facility], recommended keeping her scheduled follow-up in October with normal Check Tegretol level today No seizure activity

## 2014-08-21 NOTE — Assessment & Plan Note (Signed)
Multiple seborrheic keratoses on the face, neck, and shoulders Discussed usual course, recommended treatment only if they are irritated

## 2014-08-21 NOTE — Assessment & Plan Note (Signed)
reassurance provided, small irregularly bordered nevus on the anterior neck 3 mm in size We looked at the lesion together and mirror, she will monitor it, if it changes would recommend removal with irregular border

## 2014-08-22 ENCOUNTER — Encounter: Payer: BLUE CROSS/BLUE SHIELD | Admitting: Family Medicine

## 2014-08-22 LAB — CMP14+EGFR
ALBUMIN: 4.3 g/dL (ref 3.5–5.5)
ALT: 16 IU/L (ref 0–32)
AST: 12 IU/L (ref 0–40)
Albumin/Globulin Ratio: 1.6 (ref 1.1–2.5)
Alkaline Phosphatase: 86 IU/L (ref 39–117)
BUN/Creatinine Ratio: 15 (ref 9–23)
BUN: 11 mg/dL (ref 6–24)
Bilirubin Total: 0.2 mg/dL (ref 0.0–1.2)
CALCIUM: 9.6 mg/dL (ref 8.7–10.2)
CO2: 21 mmol/L (ref 18–29)
CREATININE: 0.75 mg/dL (ref 0.57–1.00)
Chloride: 104 mmol/L (ref 97–108)
GFR calc Af Amer: 106 mL/min/{1.73_m2} (ref >59)
GFR calc non Af Amer: 92 mL/min/{1.73_m2} (ref >59)
GLOBULIN, TOTAL: 2.7 g/dL (ref 1.5–4.5)
Glucose: 87 mg/dL (ref 65–99)
Potassium: 3.9 mmol/L (ref 3.5–5.2)
Sodium: 142 mmol/L (ref 134–144)
Total Protein: 7 g/dL (ref 6.0–8.5)

## 2014-08-22 LAB — CBC WITH DIFFERENTIAL/PLATELET
BASOS ABS: 0 10*3/uL (ref 0.0–0.2)
Basos: 0 %
EOS (ABSOLUTE): 0.1 10*3/uL (ref 0.0–0.4)
EOS: 2 %
HEMOGLOBIN: 12 g/dL (ref 11.1–15.9)
Hematocrit: 36 % (ref 34.0–46.6)
Immature Grans (Abs): 0 10*3/uL (ref 0.0–0.1)
Immature Granulocytes: 0 %
LYMPHS ABS: 2.8 10*3/uL (ref 0.7–3.1)
Lymphs: 48 %
MCH: 28.8 pg (ref 26.6–33.0)
MCHC: 33.3 g/dL (ref 31.5–35.7)
MCV: 86 fL (ref 79–97)
MONOS ABS: 0.5 10*3/uL (ref 0.1–0.9)
Monocytes: 9 %
Neutrophils Absolute: 2.4 10*3/uL (ref 1.4–7.0)
Neutrophils: 41 %
PLATELETS: 293 10*3/uL (ref 150–379)
RBC: 4.17 x10E6/uL (ref 3.77–5.28)
RDW: 14.7 % (ref 12.3–15.4)
WBC: 5.9 10*3/uL (ref 3.4–10.8)

## 2014-08-22 LAB — LIPID PANEL
Chol/HDL Ratio: 4.3 ratio units (ref 0.0–4.4)
Cholesterol, Total: 194 mg/dL (ref 100–199)
HDL: 45 mg/dL (ref >39)
LDL CALC: 133 mg/dL — AB (ref 0–99)
Triglycerides: 79 mg/dL (ref 0–149)
VLDL Cholesterol Cal: 16 mg/dL (ref 5–40)

## 2014-08-22 LAB — VITAMIN D 25 HYDROXY (VIT D DEFICIENCY, FRACTURES): VIT D 25 HYDROXY: 23.5 ng/mL — AB (ref 30.0–100.0)

## 2014-08-22 LAB — T4 AND TSH
T4, Total: 6.1 ug/dL (ref 4.5–12.0)
TSH: 1.62 u[IU]/mL (ref 0.450–4.500)

## 2014-08-22 LAB — CARBAMAZEPINE LEVEL, TOTAL: Carbamazepine Lvl: 8.7 ug/mL (ref 4.0–12.0)

## 2014-10-06 ENCOUNTER — Other Ambulatory Visit: Payer: BLUE CROSS/BLUE SHIELD | Admitting: Obstetrics & Gynecology

## 2014-10-08 ENCOUNTER — Other Ambulatory Visit (HOSPITAL_COMMUNITY)
Admission: RE | Admit: 2014-10-08 | Discharge: 2014-10-08 | Disposition: A | Discharge status: Home / Self Care | Payer: BLUE CROSS/BLUE SHIELD | Source: Ambulatory Visit | Attending: Adult Health | Admitting: Adult Health

## 2014-10-08 ENCOUNTER — Ambulatory Visit (INDEPENDENT_AMBULATORY_CARE_PROVIDER_SITE_OTHER): Payer: BLUE CROSS/BLUE SHIELD | Admitting: Adult Health

## 2014-10-08 ENCOUNTER — Encounter: Payer: Self-pay | Admitting: Obstetrics & Gynecology

## 2014-10-08 VITALS — BP 120/80 | HR 80 | Ht 62.0 in | Wt 168.0 lb

## 2014-10-08 DIAGNOSIS — Z01419 Encounter for gynecological examination (general) (routine) without abnormal findings: Secondary | ICD-10-CM | POA: Diagnosis not present

## 2014-10-08 DIAGNOSIS — Z1212 Encounter for screening for malignant neoplasm of rectum: Secondary | ICD-10-CM | POA: Diagnosis not present

## 2014-10-08 DIAGNOSIS — Z1151 Encounter for screening for human papillomavirus (HPV): Secondary | ICD-10-CM | POA: Diagnosis present

## 2014-10-08 DIAGNOSIS — N924 Excessive bleeding in the premenopausal period: Secondary | ICD-10-CM

## 2014-10-08 LAB — HEMOCCULT GUIAC POC 1CARD (OFFICE): Fecal Occult Blood, POC: NEGATIVE

## 2014-10-08 NOTE — Progress Notes (Signed)
Patient ID: [name], female   DOB: 1960/09/19, 54 y.o.   MRN: [id number] History of Present Illness:  Suzanne Jimenez is a 54 year old black female, in for well woman gyn exam and pap.Taking Megace and has some spotting occasionally. PCP is [facility] [place].  Current Medications, Allergies, Past Medical History, Past Surgical History, Family History and Social History were reviewed in [employer] record.     Review of Systems: Patient denies any headaches, hearing loss, fatigue, blurred vision, shortness of breath, chest pain, abdominal pain, problems with bowel movements, urination, or intercourse. No joint pain or mood swings.Notices vaginal odor at times.    Physical Exam:BP 120/80 mmHg  Pulse 80  Ht  (1.575 m)  Wt 168 lb (76.204 kg)  BMI 30.72 kg/m2 General:  Well developed, well nourished, no acute distress Skin:  Warm and dry Neck:  Midline trachea, normal thyroid, good ROM, no lymphadenopathy Lungs; Clear to auscultation bilaterally Breast:  No dominant palpable mass, retraction, or nipple discharge Cardiovascular: Regular rate and rhythm Abdomen:  Soft, non tender, no hepatosplenomegaly Pelvic:  External genitalia is normal in appearance, no lesions.  The vagina is normal in appearance. Urethra has no lesions or masses. The cervix is bulbous.Pap with HPV performed.  Uterus is felt to be normal size, shape, and contour.  No adnexal masses or tenderness noted.Bladder is non tender, no masses felt. Rectal: Good sphincter tone, no polyps, or hemorrhoids felt.  Hemoccult negative. Extremities/musculoskeletal:  No swelling or varicosities noted, no clubbing or cyanosis Psych:  No mood changes, alert and cooperative,seems happy   Impression: Well woman gyn exam and pap  Perimenopausal menorrhagia, on megace    Plan: Physical in 1 year Pap in 3 if normal Mammogram yearly Colonoscopy per GI Labs with PCP Review handout on menopause   Continue megace has refills

## 2014-10-08 NOTE — Patient Instructions (Signed)
Physical in 1 year Mammogram yearly Labs with PCP Colonoscopy per GI Menopause Menopause is the normal time of life when menstrual periods stop completely. Menopause is complete when you have missed 12 consecutive menstrual periods. It usually occurs between the ages of 48 years and 55 years. Very rarely does a woman develop menopause before the age of 40 years. At menopause, your ovaries stop producing the female hormones estrogen and progesterone. This can cause undesirable symptoms and also affect your health. Sometimes the symptoms may occur 4-5 years before the menopause begins. There is no relationship between menopause and:  Oral contraceptives.  Number of children you had.  Race.  The age your menstrual periods started (menarche). Heavy smokers and very thin women may develop menopause earlier in life. CAUSES  The ovaries stop producing the female hormones estrogen and progesterone.  Other causes include:  Surgery to remove both ovaries.  The ovaries stop functioning for no known reason.  Tumors of the pituitary gland in the brain.  Medical disease that affects the ovaries and hormone production.  Radiation treatment to the abdomen or pelvis.  Chemotherapy that affects the ovaries. SYMPTOMS   Hot flashes.  Night sweats.  Decrease in sex drive.  Vaginal dryness and thinning of the vagina causing painful intercourse.  Dryness of the skin and developing wrinkles.  Headaches.  Tiredness.  Irritability.  Memory problems.  Weight gain.  Bladder infections.  Hair growth of the face and chest.  Infertility. More serious symptoms include:  Loss of bone (osteoporosis) causing breaks (fractures).  Depression.  Hardening and narrowing of the arteries (atherosclerosis) causing heart attacks and strokes. DIAGNOSIS   When the menstrual periods have stopped for 12 straight months.  Physical exam.  Hormone studies of the blood. TREATMENT  There are  many treatment choices and nearly as many questions about them. The decisions to treat or not to treat menopausal changes is an individual choice made with your health care provider. Your health care provider can discuss the treatments with you. Together, you can decide which treatment will work best for you. Your treatment choices may include:   Hormone therapy (estrogen and progesterone).  Non-hormonal medicines.  Treating the individual symptoms with medicine (for example antidepressants for depression).  Herbal medicines that may help specific symptoms.  Counseling by a psychiatrist or psychologist.  Group therapy.  Lifestyle changes including:  Eating healthy.  Regular exercise.  Limiting caffeine and alcohol.  Stress management and meditation.  No treatment. HOME CARE INSTRUCTIONS   Take the medicine your health care provider gives you as directed.  Get plenty of sleep and rest.  Exercise regularly.  Eat a diet that contains calcium (good for the bones) and soy products (acts like estrogen hormone).  Avoid alcoholic beverages.  Do not smoke.  If you have hot flashes, dress in layers.  Take supplements, calcium, and vitamin D to strengthen bones.  You can use over-the-counter lubricants or moisturizers for vaginal dryness.  Group therapy is sometimes very helpful.  Acupuncture may be helpful in some cases. SEEK MEDICAL CARE IF:   You are not sure you are in menopause.  You are having menopausal symptoms and need advice and treatment.  You are still having menstrual periods after age 35 years.  You have pain with intercourse.  Menopause is complete (no menstrual period for 12 months) and you develop vaginal bleeding.  You need a referral to a specialist (gynecologist, psychiatrist, or psychologist) for treatment. SEEK IMMEDIATE MEDICAL CARE IF:  You have severe depression.  You have excessive vaginal bleeding.  You fell and think you have a  broken bone.  You have pain when you urinate.  You develop leg or chest pain.  You have a fast pounding heart beat (palpitations).  You have severe headaches.  You develop vision problems.  You feel a lump in your breast.  You have abdominal pain or severe indigestion. Document Released: 03/19/2003 Document Revised: 08/29/2012 Document Reviewed: 07/26/2012 [facility] Patient Information 2015 [place], [place]. This information is not intended to replace advice given to you by your health care provider. Make sure you discuss any questions you have with your health care provider.

## 2014-10-09 LAB — CYTOLOGY - PAP

## 2014-10-14 ENCOUNTER — Encounter: Payer: Self-pay | Admitting: Family Medicine

## 2014-10-14 ENCOUNTER — Ambulatory Visit (INDEPENDENT_AMBULATORY_CARE_PROVIDER_SITE_OTHER): Payer: BLUE CROSS/BLUE SHIELD | Admitting: Family Medicine

## 2014-10-14 VITALS — BP 118/77 | HR 90 | Temp 98.6°F | Ht 62.0 in | Wt 169.8 lb

## 2014-10-14 DIAGNOSIS — Z23 Encounter for immunization: Secondary | ICD-10-CM | POA: Diagnosis not present

## 2014-10-14 DIAGNOSIS — M79643 Pain in unspecified hand: Secondary | ICD-10-CM | POA: Diagnosis not present

## 2014-10-14 DIAGNOSIS — M79609 Pain in unspecified limb: Secondary | ICD-10-CM | POA: Insufficient documentation

## 2014-10-14 NOTE — Patient Instructions (Signed)
   Great to see you again!  Please fill out our survey!  It sounds like you are having swelling and that is causing some median nerve compression as it passes through the carpel tunnel.   Watch your salt intake Try cock up splints on one hand or the other, just at night, try to give it several nights to see if helps  If this isnt better na month or so we can re-check labs and start a mild fluid pill to take away the swelling. This may cause low blood pressure so I would try to see if it resolves first.

## 2014-10-14 NOTE — Progress Notes (Signed)
   HPI  Patient presents today here for bilateral thumb and hand pain.  Patient explains that she's had this symptoms for about one month. She first noticed swelling and difficulty wearing her usual rings. She then began to develop slight pain described as tightness type pain exacerbated by movement and using it, at nighttime she has numbness and tingling type pains in her thumb and fingers.  She denies fever, chills, sweats. She has tried to cut back on her salt intake  PMH: Smoking status noted ROS: Per HPI  Objective: BP 118/77 mmHg  Pulse 90  Temp(Src) 98.6 F (37 C) (Oral)  Ht  (1.575 m)  Wt 169 lb 12.8 oz (77.021 kg)  BMI 31.05 kg/m2 Gen: NAD, alert, cooperative with exam HEENT: NCAT Ext: No edema, warm Neuro: Alert and oriented, negative Tinel's and Phalen's test, strength 5/5 and sensation intact in bilateral hands  Assessment and plan:  # Swelling, carpal tunnel syndrome Unclear etiology of the swelling, however do believe that it is causing a carpal tunnel type syndrome Travel cock-up splint, limit salt intake If this is persistent for another month then I would recheck labs, considering that Tegretol can cause SIADH which could cause increase in swelling and fluid shifts.  Murtis Sink, MD [facility] 10/14/2014, 3:57 PM

## 2014-10-27 ENCOUNTER — Encounter: Payer: Self-pay | Admitting: Nurse Practitioner

## 2014-10-27 ENCOUNTER — Ambulatory Visit (INDEPENDENT_AMBULATORY_CARE_PROVIDER_SITE_OTHER): Payer: BLUE CROSS/BLUE SHIELD | Admitting: Nurse Practitioner

## 2014-10-27 VITALS — BP 127/84 | HR 90 | Ht 63.0 in | Wt 168.6 lb

## 2014-10-27 DIAGNOSIS — G40109 Localization-related (focal) (partial) symptomatic epilepsy and epileptic syndromes with simple partial seizures, not intractable, without status epilepticus: Secondary | ICD-10-CM

## 2014-10-27 MED ORDER — CARBAMAZEPINE 200 MG PO TABS: 200.0000 mg | ORAL_TABLET | Freq: Two times a day (BID) | ORAL | Status: DC

## 2014-10-27 NOTE — Progress Notes (Signed)
I agree with the assessment and plan as directed by NP .The patient is known to me .   Consandra Laske, MD  

## 2014-10-27 NOTE — Progress Notes (Signed)
[facility]  PATIENT: Suzanne Jimenez DOB: February 27, 1960   REASON FOR VISIT: Follow-up for seizure disorder HISTORY FROM: Patient    HISTORY OF PRESENT ILLNESS:Suzanne Jimenez, 54 -year-old female returns today for followup. 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 She had surgical removal of the AV malformation on the left and seizures have been under good control since that time. She is currently on Tegretol 200mg  twice daily. She denies any side effects to the medication, no daytime drowsiness, no feelings of being off balance. She denies missing any doses. She denies any headache. She has had no falls. Appetite is good, sleeping well. No new neurologic complaints. Recent CBZ level was 8.7 on 08/21/2014. CBC and CMP within normal limits   REVIEW OF SYSTEMS: Full 14 system review of systems performed and notable only for those listed, all others are neg:  Constitutional: neg  Cardiovascular: neg Ear/Nose/Throat: neg  Skin: neg Eyes: neg Respiratory: neg Gastroitestinal: neg  Hematology/Lymphatic: neg  Endocrine: neg Musculoskeletal: Carpal tunnel syndrome left hand Allergy/Immunology: neg Neurological: neg Psychiatric: neg Sleep : neg   ALLERGIES: Allergies  Allergen Reactions  . Dilantin [Phenytoin] Swelling    Swelling of lymph nodes    HOME MEDICATIONS: Outpatient Prescriptions Prior to Visit  Medication Sig Dispense Refill  . carbamazepine (TEGRETOL) 200 MG tablet Take 1 tablet (200 mg total) by mouth 2 (two) times daily. 60 tablet 11  . cholecalciferol (VITAMIN D) 1000 UNITS tablet Take 1,000 Units by mouth daily.    . ferrous sulfate 325 (65 FE) MG tablet Take 325 mg by mouth daily as needed (for defficiency).    . fish oil-omega-3 fatty acids 1000 MG capsule Take 2 g by mouth daily.    . megestrol (MEGACE) 40 MG tablet TAKE THREE TABLETS BY MOUTH ONCE DAILY 90 tablet 11   No facility-administered medications prior to  visit.    PAST MEDICAL HISTORY: Past Medical History  Diagnosis Date  . Seizures (HCC) 1990  . Asthma   . Anemia   . Hyperlipidemia   . Abnormal Pap smear 1999  . Complication of anesthesia     Takes a while to wake up  . Vaginal Pap smear, abnormal   . Vaginal discharge 05/07/2013  . BV (bacterial vaginosis) 05/07/2013  . Carpal tunnel syndrome, bilateral 09/2014    PAST SURGICAL HISTORY: Past Surgical History  Procedure Laterality Date  . Tubal ligation    . Brain surgery  1990  . Cesarean section  2004  . Gynecologic cryosurgery  1999    Abnormal Pap  . Colonoscopy N/A 03/13/2013    Procedure: COLONOSCOPY;  Surgeon: Malissa HippoNajeeb U Rehman, MD;  Location: [facility];  Service: Endoscopy;  Laterality: N/A;  830    FAMILY HISTORY: Family History  Problem Relation Age of Onset  . Diabetes Mother   . Hypertension Mother   . Diabetes Father   . Heart disease Father   . Hypertension Father   . Heart disease Brother   . Colon cancer Neg Hx   . Diabetes Maternal Aunt   . Hypertension Maternal Aunt   . Diabetes Maternal Uncle   . Hypertension Maternal Uncle     SOCIAL HISTORY: Social History   Social History  . Marital Status: Married    Spouse Name: N/A  . Number of Children: 2  . Years of Education: college4   Occupational History  . 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 Smoking status: Never Smoker   . Smokeless tobacco: Never Used  . Alcohol Use: No  . Drug Use: No  . Sexual Activity:    Partners: Male    Birth Control/ Protection: Surgical, Post-menopausal     Comment: tubal   Other Topics Concern  . Not on file   Social History Narrative     PHYSICAL EXAM  Filed Vitals:   10/27/14 1342  BP: 127/84  Pulse: 90  Height:  (1.6 m)  Weight: 168 lb 9.6 oz (76.476 kg)   Body mass index is 29.87 kg/(m^2). Generalized: Well developed, in no acute distress  Head: normocephalic and atraumatic,. Oropharynx benign   Neck: Supple, no carotid bruits  Cardiac: Regular rate rhythm, no murmur  Musculoskeletal: No deformity  Neurological examination  Mentation: Alert oriented to time, place, history taking. Follows all commands speech and language fluent  Cranial nerve II-XII: Pupils were equal round reactive to light extraocular movements were full, visual field were full on confrontational test. Facial sensation and strength were normal. hearing was intact to finger rubbing bilaterally. Uvula tongue midline. head turning and shoulder shrug and were normal and symmetric.Tongue protrusion into cheek strength was normal.  Motor: normal bulk and tone, full strength in the BUE, BLE, fine finger movements normal, no pronator drift. No focal weakness, brace to the left wrist Coordination: finger-nose-finger, heel-to-shin bilaterally, no dysmetria  Reflexes: Brachioradialis 2/2, biceps 2/2, triceps 2/2, patellar 2/2, Achilles 2/2, plantar responses were flexor bilaterally.  Gait and Station: Rising up from seated position without assistance, normal stance, moderate stride, good arm swing, smooth turning, tandem gait steady   DIAGNOSTIC DATA (LABS, IMAGING, TESTING) - I reviewed patient records, labs, notes, testing and imaging myself where available.  Lab Results  Component Value Date   WBC 5.9 08/21/2014   HGB 11.0* 01/08/2013   HCT 36.0 08/21/2014   MCV 85 01/08/2013   PLT 225 01/08/2013      Component Value Date/Time   NA 142 08/21/2014 1330   K 3.9 08/21/2014 1330   CL 104 08/21/2014 1330   CO2 21 08/21/2014 1330   GLUCOSE 87 08/21/2014 1330   BUN 11 08/21/2014 1330   CREATININE 0.75 08/21/2014 1330   CALCIUM 9.6 08/21/2014 1330   PROT 7.0 08/21/2014 1330   ALBUMIN 4.3 08/21/2014 1330   AST 12 08/21/2014 1330   ALT 16 08/21/2014 1330   ALKPHOS 86 08/21/2014 1330   BILITOT 0.2 08/21/2014 1330   BILITOT <0.2 01/08/2013 1132   GFRNONAA 92 08/21/2014 1330   GFRAA 106 08/21/2014 1330    Lab Results  Component Value Date   CHOL 194 08/21/2014   HDL 45 08/21/2014   LDLCALC 133* 08/21/2014   TRIG 79 08/21/2014   CHOLHDL 4.3 08/21/2014    Lab Results  Component Value Date   TSH 1.620 08/21/2014      ASSESSMENT AND PLAN  54 y.o. year old female  has a past medical history of Seizures (HCC) (1990);  and Carpal tunnel syndrome, bilateral (09/2014). here to follow-up.  Most recent CBZ level 8.7  On 08/21/14. CBC and CMP WNL. Continue Tegretol  twice daily will refill Call for seizure activity F/U yearly and prn Nilda Riggs, [facility], [facility], APRN  [facility] [address] [place], [place] [address] [phone]

## 2014-10-27 NOTE — Patient Instructions (Addendum)
Most recent CBZ level 8.7 2 months ago Continue Tegretol 200mg  twice daily Call for seizure activity F/U yearly and prn

## 2015-01-01 ENCOUNTER — Ambulatory Visit: Payer: BLUE CROSS/BLUE SHIELD | Admitting: Family Medicine

## 2015-01-01 ENCOUNTER — Encounter: Payer: Self-pay | Admitting: Family Medicine

## 2015-01-01 ENCOUNTER — Ambulatory Visit (INDEPENDENT_AMBULATORY_CARE_PROVIDER_SITE_OTHER): Payer: BLUE CROSS/BLUE SHIELD | Admitting: Family Medicine

## 2015-01-01 VITALS — BP 139/83 | HR 104 | Temp 99.1°F | Ht 63.0 in | Wt 174.4 lb

## 2015-01-01 DIAGNOSIS — J209 Acute bronchitis, unspecified: Secondary | ICD-10-CM | POA: Diagnosis not present

## 2015-01-01 MED ORDER — ALBUTEROL SULFATE HFA 108 (90 BASE) MCG/ACT IN AERS: 2.0000 | INHALATION_SPRAY | Freq: Four times a day (QID) | RESPIRATORY_TRACT | Status: DC | PRN

## 2015-01-01 MED ORDER — FLUTICASONE PROPIONATE 50 MCG/ACT NA SUSP: 2.0000 | Freq: Every day | NASAL | Status: DC

## 2015-01-01 MED ORDER — AZITHROMYCIN 250 MG PO TABS: ORAL_TABLET | ORAL | Status: DC

## 2015-01-01 NOTE — Patient Instructions (Signed)
Great to see you!  If you are not improving by Saturday go ahead and start the azithromycin  Acute Bronchitis Bronchitis is when the airways that extend from the windpipe into the lungs get red, puffy, and painful (inflamed). Bronchitis often causes thick spit (mucus) to develop. This leads to a cough. A cough is the most common symptom of bronchitis. In acute bronchitis, the condition usually begins suddenly and goes away over time (usually in 2 weeks). Smoking, allergies, and asthma can make bronchitis worse. Repeated episodes of bronchitis may cause more lung problems. HOME CARE  Rest.  Drink enough fluids to keep your pee (urine) clear or pale yellow (unless you need to limit fluids as told by your doctor).  Only take over-the-counter or prescription medicines as told by your doctor.  Avoid smoking and secondhand smoke. These can make bronchitis worse. If you are a smoker, think about using nicotine gum or skin patches. Quitting smoking will help your lungs heal faster.  Reduce the chance of getting bronchitis again by:  Washing your hands often.  Avoiding people with cold symptoms.  Trying not to touch your hands to your mouth, nose, or eyes.  Follow up with your doctor as told. GET HELP IF: Your symptoms do not improve after 1 week of treatment. Symptoms include:  Cough.  Fever.  Coughing up thick spit.  Body aches.  Chest congestion.  Chills.  Shortness of breath.  Sore throat. GET HELP RIGHT AWAY IF:   You have an increased fever.  You have chills.  You have severe shortness of breath.  You have bloody thick spit (sputum).  You throw up (vomit) often.  You lose too much body fluid (dehydration).  You have a severe headache.  You faint. MAKE SURE YOU:   Understand these instructions.  Will watch your condition.  Will get help right away if you are not doing well or get worse.   This information is not intended to replace advice given to you  by your health care provider. Make sure you discuss any questions you have with your health care provider.   Document Released: 06/15/2007 Document Revised: 08/29/2012 Document Reviewed: 06/19/2012 Elsevier Interactive Patient Education [employer]2016 Elsevier Inc.

## 2015-01-01 NOTE — Progress Notes (Signed)
   HPI  Patient presents today . Today for cough and cold.  Patient explains she's had 2 days of cough, nasal congestion, slight dyspnea.  She denies fever, chills, chest pain. He does also have frequent throat clearing in the morning, sinus medicine over-the-counter has helped a little bit.  She's tolerating food and fluids easily.   PMH: Smoking status noted ROS: Per HPI  Objective: BP 139/83 mmHg  Pulse 104  Temp(Src) 99.1 F (37.3 C) (Oral)  Ht 5\' 3"  (1.6 m)  Wt 174 lb 6.4 oz (79.107 kg)  BMI 30.90 kg/m2 Gen: NAD, alert, cooperative with exam HEENT: NCAT, TMs normal bilaterally, nares with swollen turbinates bilaterally but left slightly worse than the right, oropharynx clear CV: RRR, good S1/S2, no murmur Resp: Nonlabored, expiratory coarse sounds throughout with some expiratory wheeze Ext: No edema, warm Neuro: Alert and oriented, No gross deficits  Assessment and plan:  # Acute bronchitis She with some lung exam findings but overall well-appearing. Given that Christmas is 3 days away I will go ahead and give her a prescription for azithromycin to take if she is not resolving in 5 days or if she develops fever, malaise, or worsening respiratory symptoms. Albuterol, some concern for history of asthma Flonase, she does have quite a bit of turbinate swelling and postnasal drip symptoms    Meds ordered this encounter  Medications  . albuterol (PROVENTIL HFA;VENTOLIN HFA) 108 (90 BASE) MCG/ACT inhaler    Sig: Inhale 2 puffs into the lungs every 6 (six) hours as needed for wheezing or shortness of breath.    Dispense:  1 Inhaler    Refill:  0  . fluticasone (FLONASE) 50 MCG/ACT nasal spray    Sig: Place 2 sprays into both nostrils daily.    Dispense:  16 g    Refill:  6  . azithromycin (ZITHROMAX) 250 MG tablet    Sig: Take 2 tablets on day 1 and 1 tablet daily after that    Dispense:  6 tablet    Refill:  0    Murtis SinkSam Bradshaw, MD [facility] 01/01/2015, 2:58 PM

## 2015-01-30 ENCOUNTER — Encounter: Payer: Self-pay | Admitting: Family Medicine

## 2015-01-30 ENCOUNTER — Ambulatory Visit (INDEPENDENT_AMBULATORY_CARE_PROVIDER_SITE_OTHER): Payer: BLUE CROSS/BLUE SHIELD

## 2015-01-30 ENCOUNTER — Ambulatory Visit (INDEPENDENT_AMBULATORY_CARE_PROVIDER_SITE_OTHER): Payer: BLUE CROSS/BLUE SHIELD | Admitting: Family Medicine

## 2015-01-30 VITALS — BP 136/78 | HR 83 | Temp 98.7°F | Ht 63.0 in | Wt 176.2 lb

## 2015-01-30 DIAGNOSIS — J209 Acute bronchitis, unspecified: Secondary | ICD-10-CM

## 2015-01-30 DIAGNOSIS — R059 Cough, unspecified: Secondary | ICD-10-CM

## 2015-01-30 DIAGNOSIS — R05 Cough: Secondary | ICD-10-CM | POA: Diagnosis not present

## 2015-01-30 MED ORDER — FLUTICASONE FUROATE-VILANTEROL 100-25 MCG/INH IN AEPB: 1.0000 | INHALATION_SPRAY | Freq: Every day | RESPIRATORY_TRACT | Status: DC

## 2015-01-30 MED ORDER — PREDNISONE 20 MG PO TABS: 40.0000 mg | ORAL_TABLET | Freq: Every day | ORAL | Status: DC

## 2015-01-30 MED ORDER — HYDROCODONE-HOMATROPINE 5-1.5 MG/5ML PO SYRP: 5.0000 mL | ORAL_SOLUTION | Freq: Four times a day (QID) | ORAL | Status: DC | PRN

## 2015-01-30 NOTE — Patient Instructions (Signed)
Great to see you!  Take prednisone and the new inhaler for your persistent cough  The cough syrup has a narcotic in it(hydrocodone) so do not drive afterwards.   Lets see you back in 6 weeks.   Take the inhaler 1 inhalation every day

## 2015-01-30 NOTE — Progress Notes (Signed)
   HPI  Patient presents today here with cough and cold symptoms.  Patient explains that she's had cough, sore throat, nasal congestion and some ear pain for the last 3 days. She states that after being treated in December for acute bronchitis her cough improved but never really went away. She does have a history of asthma but has not bothered her in several years.  Albuterol does seem to be helping her.  She has no chest pain, malaise, fever She's hearing normally  PMH: Smoking status noted ROS: Per HPI  Objective: BP 136/78 mmHg  Pulse 83  Temp(Src) 98.7 F (37.1 C) (Oral)  Ht  (1.6 m)  Wt 176 lb 3.2 oz (79.924 kg)  BMI 31.22 kg/m2 Gen: NAD, alert, cooperative with exam HEENT: NCAT, no sinus tenderness to palpation, TMs normal bilaterally nares with slight swelling, oropharynx clear CV: RRR, good S1/S2, no murmur Resp: Nonlabored, coarse expiratory sounds, not many wheezes appreciated this time Ext: No edema, warm Neuro: Alert and oriented, No gross deficits  Reviewed how to take Breo and first dose given in the office   Chest x-ray No acute findings, no infiltrates appreciated  Assessment and plan:  # Acute bronchitis On possibly underlying mild intermittent asthma Her chest x-ray is negative for infiltrate today prednisone, start Breo X 1 month, f/u 6 weeks Hycodan for nighttime cough, RTC if worsening or not improving   Orders Placed This Encounter  Procedures  . DG Chest 2 View    Standing Status: Future     Number of Occurrences: 1     Standing Expiration Date: 03/31/2016    Order Specific Question:  Reason for Exam (SYMPTOM  OR DIAGNOSIS REQUIRED)    Answer:  cough    Order Specific Question:  Is the patient pregnant?    Answer:  No    Order Specific Question:  Preferred imaging location?    Answer:  Internal    Meds ordered this encounter  Medications  . predniSONE (DELTASONE) 20 MG tablet    Sig: Take 2 tablets (40 mg total) by mouth daily  with breakfast.    Dispense:  10 tablet    Refill:  0  . Fluticasone Furoate-Vilanterol (BREO ELLIPTA) 100-25 MCG/INH AEPB    Sig: Inhale 1 puff into the lungs daily.    Dispense:  2 each    Refill:  0  . HYDROcodone-homatropine (HYCODAN) 5-1.5 MG/5ML syrup    Sig: Take 5 mLs by mouth every 6 (six) hours as needed for cough.    Dispense:  120 mL    Refill:  0    Murtis Sink, MD [facility] 01/30/2015, 11:52 AM

## 2015-03-13 ENCOUNTER — Ambulatory Visit: Payer: BLUE CROSS/BLUE SHIELD | Admitting: Family Medicine

## 2015-04-03 LAB — HM MAMMOGRAPHY

## 2015-04-22 ENCOUNTER — Encounter: Payer: Self-pay | Admitting: *Deleted

## 2015-05-30 IMAGING — US US TRANSVAGINAL NON-OB
1 series · 14 of 25 positions shown · non-contrast
Comparison: None

CLINICAL DATA: Dysfunctional uterine bleeding

TRANSABDOMINAL AND TRANSVAGINAL ULTRASOUND OF PELVIS
TECHNIQUE: Both transabdominal and transvaginal ultrasound
examinations of the pelvis were performed. Transabdominal technique
was performed for global imaging of the pelvis including uterus,
ovaries, adnexal regions, and pelvic cul-de-sac.
It was necessary to proceed with endovaginal exam following the
transabdominal exam to visualize the endometrial complex and right
ovary.

[Series 1: us transvaginal non-ob · 0.23mm/px · 14 of 93 slices shown]
[im 1/93]
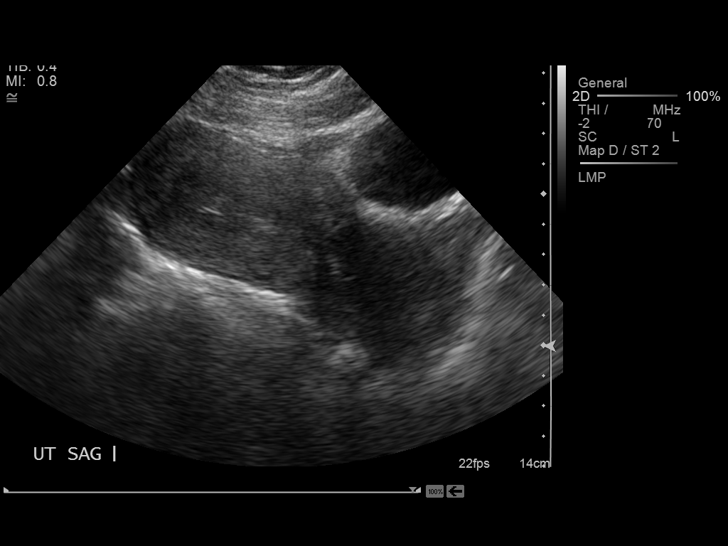
[im 8/93]
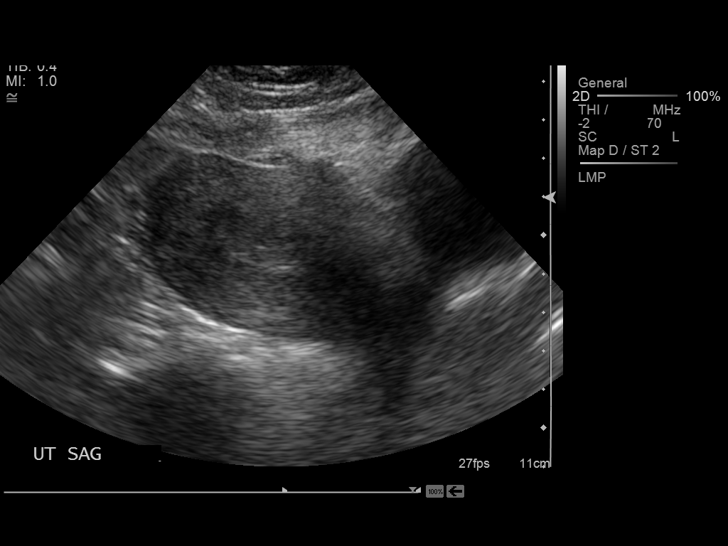
[im 16/93]
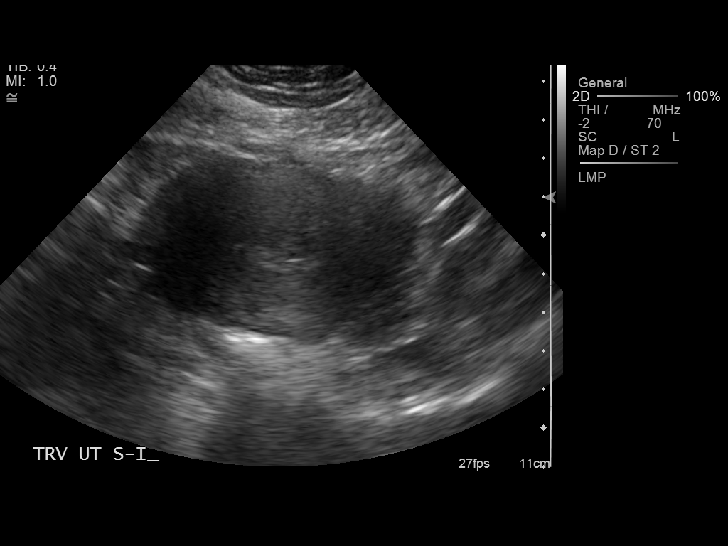
[im 24/93]
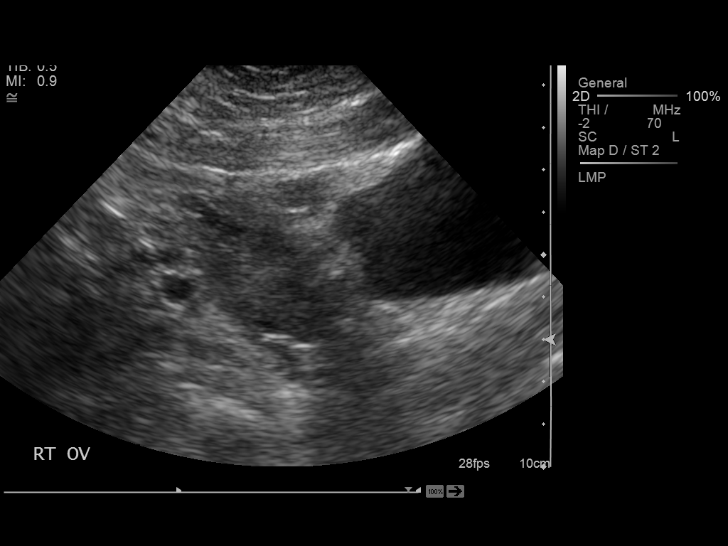
[im 31/93]
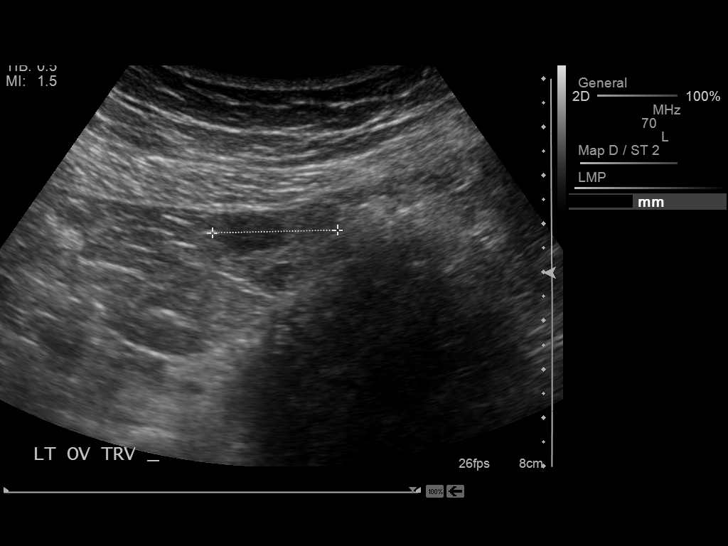
[im 35/93]
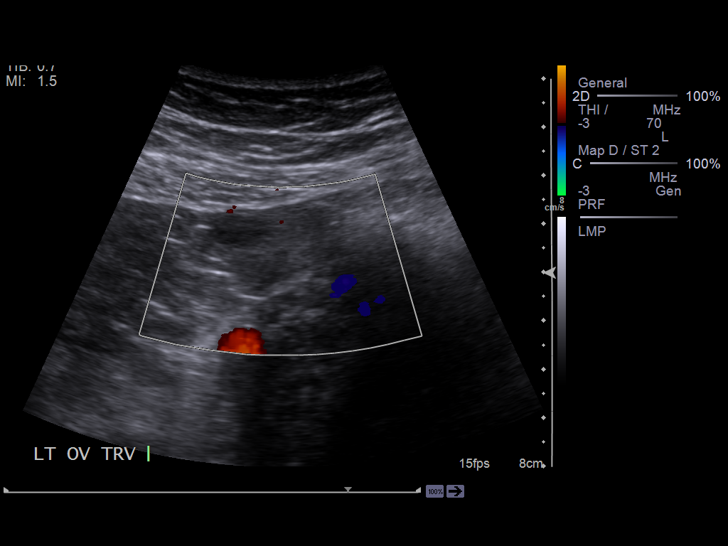
[im 43/93]
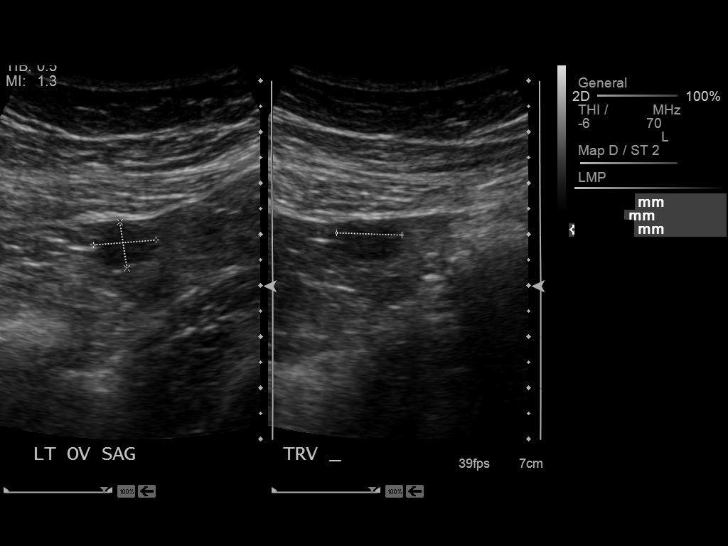
[im 50/93]
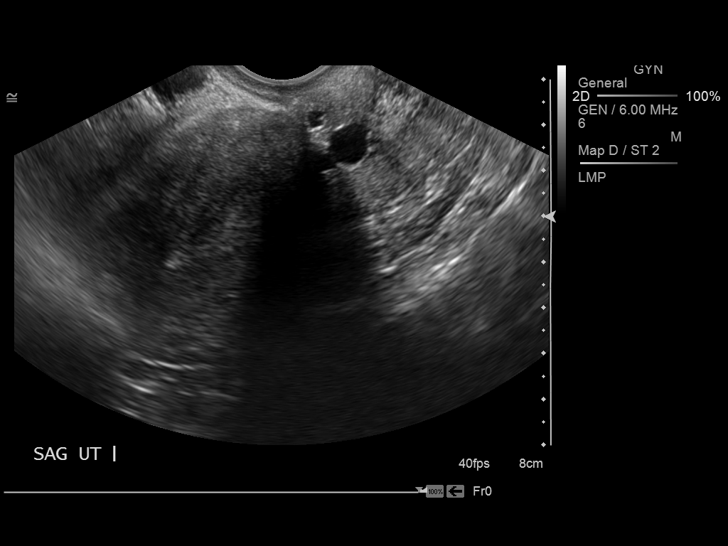
[im 58/93]
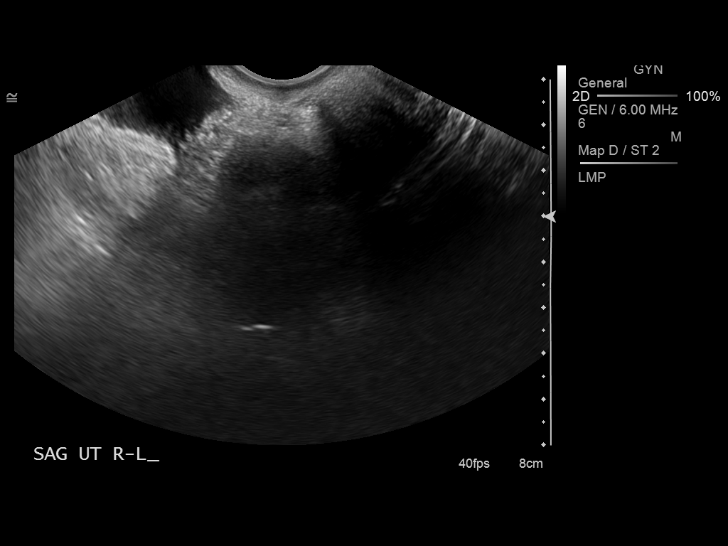
[im 62/93]
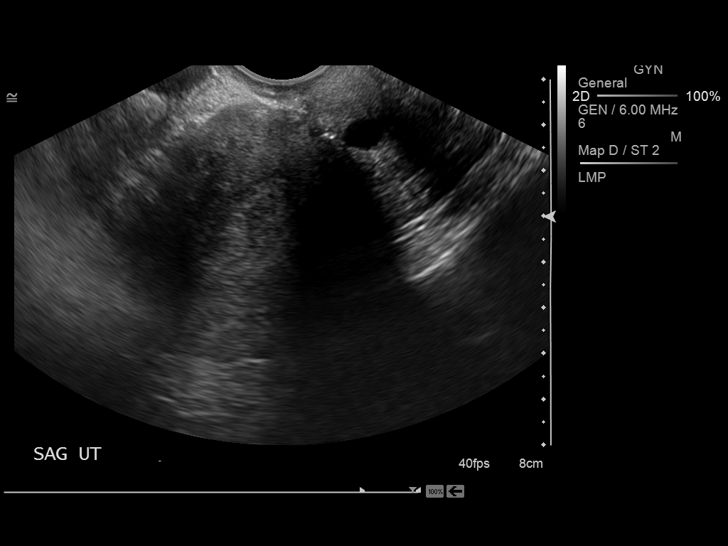
[im 70/93]
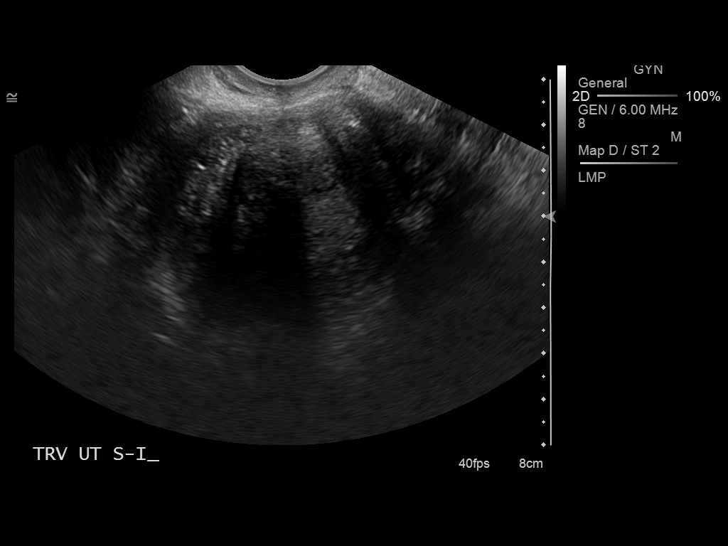
[im 77/93]
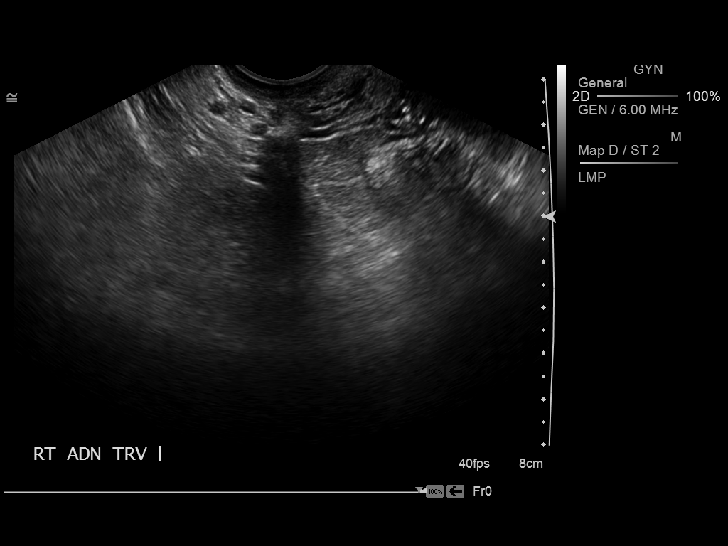
[im 85/93]
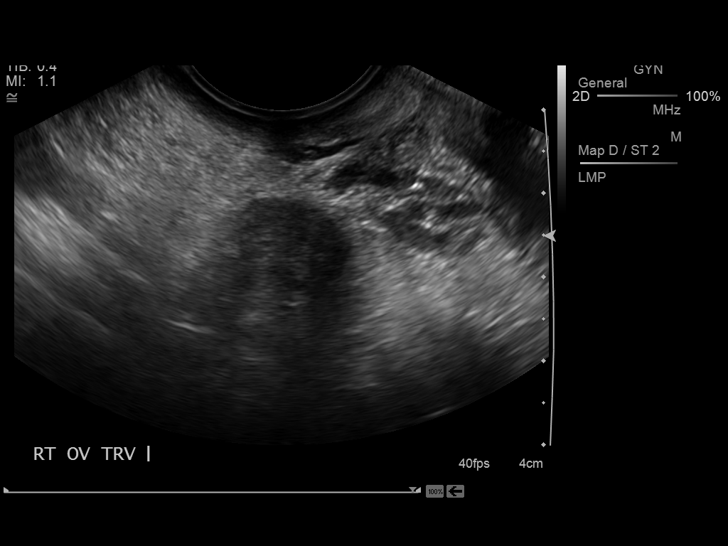
[im 93/93]
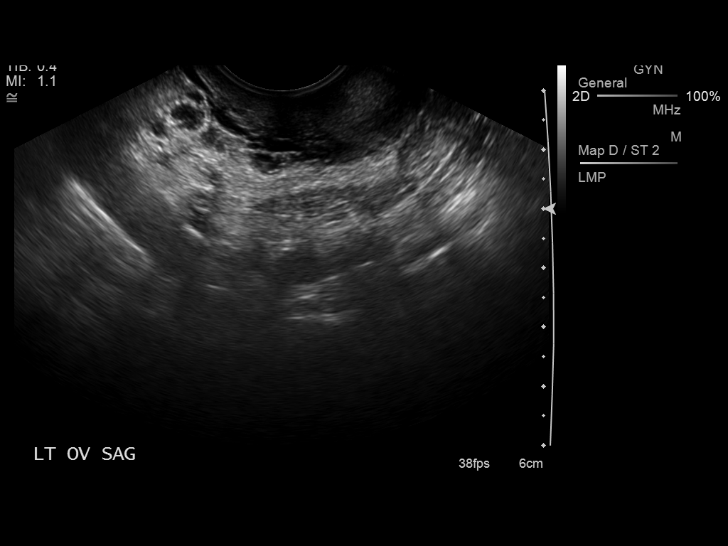

[14 of 25 positions shown; findings below may reference images not displayed]

FINDINGS: Uterus: 12.0 x 4.8 x 6.8 cm.  Normal morphology without mass.
Nabothian cysts at cervix.

Endometrium: Thickened, 17 mm thick.  No endometrial fluid.

Right ovary:  2.5 x 1.5 1.4 cm.  Normal morphology without mass.

Left ovary: 2.6 x 1.5 x 2.6 cm.  Seen only on transabdominal
imaging due to high position.  Normal morphology without mass.

Other findings: No free fluid or adnexal masses.
IMPRESSION: Thickened endometrial complex, nonspecific, can be seen with
hyperplasia, polyps, and tumor.
Otherwise negative exam..

## 2015-07-06 ENCOUNTER — Ambulatory Visit (INDEPENDENT_AMBULATORY_CARE_PROVIDER_SITE_OTHER): Payer: BLUE CROSS/BLUE SHIELD | Admitting: Family Medicine

## 2015-07-06 ENCOUNTER — Encounter: Payer: Self-pay | Admitting: Family Medicine

## 2015-07-06 VITALS — BP 109/73 | HR 76 | Temp 98.1°F | Ht 63.0 in | Wt 163.4 lb

## 2015-07-06 DIAGNOSIS — R609 Edema, unspecified: Secondary | ICD-10-CM | POA: Diagnosis not present

## 2015-07-06 DIAGNOSIS — G40109 Localization-related (focal) (partial) symptomatic epilepsy and epileptic syndromes with simple partial seizures, not intractable, without status epilepticus: Secondary | ICD-10-CM

## 2015-07-06 DIAGNOSIS — R0789 Other chest pain: Secondary | ICD-10-CM | POA: Diagnosis not present

## 2015-07-06 MED ORDER — HYDROCHLOROTHIAZIDE 12.5 MG PO CAPS: 12.5000 mg | ORAL_CAPSULE | Freq: Every day | ORAL | Status: DC

## 2015-07-06 NOTE — Patient Instructions (Addendum)
Great to see you!  Please have your labs sent to us (or drop by and let us make a copy)  Nonspecific Chest Pain  Chest pain can be caused by many different conditions. There is always a chance that your pain could be related to something serious, such as a heart attack or a blood clot in your lungs. Chest pain can also be caused by conditions that are not life-threatening. If you have chest pain, it is very important to follow up with your health care provider. CAUSES  Chest pain can be caused by:  Heartburn.  Pneumonia or bronchitis.  Anxiety or stress.  Inflammation around your heart (pericarditis) or lung (pleuritis or pleurisy).  A blood clot in your lung.  A collapsed lung (pneumothorax). It can develop suddenly on its own (spontaneous pneumothorax) or from trauma to the chest.  Shingles infection (varicella-zoster virus).  Heart attack.  Damage to the bones, muscles, and cartilage that make up your chest wall. This can include:  Bruised bones due to injury.  Strained muscles or cartilage due to frequent or repeated coughing or overwork.  Fracture to one or more ribs.  Sore cartilage due to inflammation (costochondritis). RISK FACTORS  Risk factors for chest pain may include:  Activities that increase your risk for trauma or injury to your chest.  Respiratory infections or conditions that cause frequent coughing.  Medical conditions or overeating that can cause heartburn.  Heart disease or family history of heart disease.  Conditions or health behaviors that increase your risk of developing a blood clot.  Having had chicken pox (varicella zoster). SIGNS AND SYMPTOMS Chest pain can feel like:  Burning or tingling on the surface of your chest or deep in your chest.  Crushing, pressure, aching, or squeezing pain.  Dull or sharp pain that is worse when you move, cough, or take a deep breath.  Pain that is also felt in your back, neck, shoulder, or arm, or  pain that spreads to any of these areas. Your chest pain may come and go, or it may stay constant. DIAGNOSIS Lab tests or other studies may be needed to find the cause of your pain. Your health care provider may have you take a test called an ambulatory ECG (electrocardiogram). An ECG records your heartbeat patterns at the time the test is performed. You may also have other tests, such as:  Transthoracic echocardiogram (TTE). During echocardiography, sound waves are used to create a picture of all of the heart structures and to look at how blood flows through your heart.  Transesophageal echocardiogram (TEE).This is a more advanced imaging test that obtains images from inside your body. It allows your health care provider to see your heart in finer detail.  Cardiac monitoring. This allows your health care provider to monitor your heart rate and rhythm in real time.  Holter monitor. This is a portable device that records your heartbeat and can help to diagnose abnormal heartbeats. It allows your health care provider to track your heart activity for several days, if needed.  Stress tests. These can be done through exercise or by taking medicine that makes your heart beat more quickly.  Blood tests.  Imaging tests. TREATMENT  Your treatment depends on what is causing your chest pain. Treatment may include:  Medicines. These may include:  Acid blockers for heartburn.  Anti-inflammatory medicine.  Pain medicine for inflammatory conditions.  Antibiotic medicine, if an infection is present.  Medicines to dissolve blood clots.  Medicines to  treat coronary artery disease.  Supportive care for conditions that do not require medicines. This may include:  Resting.  Applying heat or cold packs to injured areas.  Limiting activities until pain decreases. HOME CARE INSTRUCTIONS  If you were prescribed an antibiotic medicine, finish it all even if you start to feel better.  Avoid any  activities that bring on chest pain.  Do not use any tobacco products, including cigarettes, chewing tobacco, or electronic cigarettes. If you need help quitting, ask your health care provider.  Do not drink alcohol.  Take medicines only as directed by your health care provider.  Keep all follow-up visits as directed by your health care provider. This is important. This includes any further testing if your chest pain does not go away.  If heartburn is the cause for your chest pain, you may be told to keep your head raised (elevated) while sleeping. This reduces the chance that acid will go from your stomach into your esophagus.  Make lifestyle changes as directed by your health care provider. These may include:  Getting regular exercise. Ask your health care provider to suggest some activities that are safe for you.  Eating a heart-healthy diet. A registered dietitian can help you to learn healthy eating options.  Maintaining a healthy weight.  Managing diabetes, if necessary.  Reducing stress. SEEK MEDICAL CARE IF:  Your chest pain does not go away after treatment.  You have a rash with blisters on your chest.  You have a fever. SEEK IMMEDIATE MEDICAL CARE IF:   Your chest pain is worse.  You have an increasing cough, or you cough up blood.  You have severe abdominal pain.  You have severe weakness.  You faint.  You have chills.  You have sudden, unexplained chest discomfort.  You have sudden, unexplained discomfort in your arms, back, neck, or jaw.  You have shortness of breath at any time.  You suddenly start to sweat, or your skin gets clammy.  You feel nauseous or you vomit.  You suddenly feel light-headed or dizzy.  Your heart begins to beat quickly, or it feels like it is skipping beats. These symptoms may represent a serious problem that is an emergency. Do not wait to see if the symptoms will go away. Get medical help right away. Call your local  emergency services (911 in the [place] Do not drive yourself to the hospital.   This information is not intended to replace advice given to you by your health care provider. Make sure you discuss any questions you have with your health care provider.   Document Released: 10/06/2004 Document Revised: 01/17/2014 Document Reviewed: 08/02/2013 Elsevier Interactive Patient Education [employer]2016 Elsevier Inc.

## 2015-07-06 NOTE — Progress Notes (Signed)
   HPI  Patient presents today here with extremity swelling, requesting a Tegretol level checked, and atypical chest pain.  Chest pain Patient explains that she had left-sided dull chest pain while driving home from work a few weeks ago. It lasted about 10 minutes and went away. She did not have any associated weakness, numbness, tingling, shortness of breath, palpitations, headache, sweating. It was nonradiating It has not recurred.  She had a stress test about 5 years ago which was normal.  She she states that her father had a heart attack at the age of 77. Her previous LDL was elevated to 1:30, her total cholesterol was normal.  Swelling All 4 extremities involved, primarily hand tightness bilaterally Has been a long-standing problem for several months. Has tried 12.5 mg of HCTZ recently with improvement in the hands but no difference in the legs. No dizziness, weakness, or headaches after starting medication.  Epilepsy Would like Tegretol level checked  PMH: Smoking status noted ROS: Per HPI  Objective: BP 109/73 mmHg  Pulse 76  Temp(Src) 98.1 F (36.7 C) (Oral)  Ht '5\' 3"'$  (1.6 m)  Wt 163 lb 6.4 oz (74.118 kg)  BMI 28.95 kg/m2 Gen: NAD, alert, cooperative with exam HEENT: NCAT CV: RRR, good S1/S2, no murmur Resp: CTABL, no wheezes, non-labored Ext: No edema, warm Neuro: Alert and oriented, No gross deficits  Assessment and plan:  # ATypical chest pain Atypical presentation, unclear etiology EKG WNL LDL elevated 10 months ago, she had recently repeated and reports normal results, will drop oiff copy Normal stress 5 years ago (per her report) Offered stress but she will wait to see if it recurs.   # Swelling Refilled HCTZ Labs, renal function  # Epilepsy Checking Tegretol level Follows with neurology to manage  HCM Tetanus today    Orders Placed This Encounter  Procedures  . Carbamazepine level, total  . CMP14+EGFR  . EKG 12-Lead    Meds  ordered this encounter  Medications  . hydrochlorothiazide (MICROZIDE) 12.5 MG capsule    Sig: Take 1 capsule (12.5 mg total) by mouth daily.    Dispense:  90 capsule    Refill:  [place], MD [facility] 07/06/2015, 3:54 PM

## 2015-07-07 LAB — CMP14+EGFR
ALBUMIN: 4.3 g/dL (ref 3.5–5.5)
ALK PHOS: 122 IU/L — AB (ref 39–117)
ALT: 18 IU/L (ref 0–32)
AST: 21 IU/L (ref 0–40)
Albumin/Globulin Ratio: 1.6 (ref 1.2–2.2)
BUN / CREAT RATIO: 16 (ref 9–23)
BUN: 12 mg/dL (ref 6–24)
CHLORIDE: 99 mmol/L (ref 96–106)
CO2: 25 mmol/L (ref 18–29)
Calcium: 10.3 mg/dL — ABNORMAL HIGH (ref 8.7–10.2)
Creatinine, Ser: 0.75 mg/dL (ref 0.57–1.00)
GFR calc Af Amer: 105 mL/min/{1.73_m2} (ref >59)
GFR calc non Af Amer: 91 mL/min/{1.73_m2} (ref >59)
GLUCOSE: 89 mg/dL (ref 65–99)
Globulin, Total: 2.7 g/dL (ref 1.5–4.5)
Potassium: 3.7 mmol/L (ref 3.5–5.2)
Sodium: 141 mmol/L (ref 134–144)
Total Protein: 7 g/dL (ref 6.0–8.5)

## 2015-07-07 LAB — CARBAMAZEPINE LEVEL, TOTAL: CARBAMAZEPINE LVL: 6.8 ug/mL (ref 4.0–12.0)

## 2015-07-08 ENCOUNTER — Telehealth: Payer: Self-pay | Admitting: Family Medicine

## 2015-07-08 NOTE — Telephone Encounter (Signed)
Reviewed labs

## 2015-10-27 ENCOUNTER — Encounter: Payer: Self-pay | Admitting: Nurse Practitioner

## 2015-10-27 ENCOUNTER — Ambulatory Visit (INDEPENDENT_AMBULATORY_CARE_PROVIDER_SITE_OTHER): Payer: BLUE CROSS/BLUE SHIELD | Admitting: Nurse Practitioner

## 2015-10-27 VITALS — BP 108/76 | HR 83 | Ht 63.0 in | Wt 167.0 lb

## 2015-10-27 DIAGNOSIS — G40109 Localization-related (focal) (partial) symptomatic epilepsy and epileptic syndromes with simple partial seizures, not intractable, without status epilepticus: Secondary | ICD-10-CM

## 2015-10-27 MED ORDER — CARBAMAZEPINE 200 MG PO TABS: 200.0000 mg | ORAL_TABLET | Freq: Two times a day (BID) | ORAL | 11 refills | Status: DC

## 2015-10-27 NOTE — Progress Notes (Signed)
[facility]  PATIENT: Suzanne Jimenez DOB: 20-Jun-1960   REASON FOR VISIT: Follow-up for seizure disorder HISTORY FROM: Patient    HISTORY OF PRESENT ILLNESS:Suzanne Jimenez, 55 -year-old female returns today for yearly followup. 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 She had surgical removal of the AV malformation on the left and seizures have been under good control since that time. She is currently on Tegretol 200mg  twice daily. No seizure activity in 20 years .She denies any side effects to the medication, no daytime drowsiness, no feelings of being off balance. She denies missing any doses. She denies any headache. She has had no falls. Appetite is good, sleeping well. No new neurologic complaints. Recent CBZ level was  6.8 on 07/06/15 CBC and CMP within normal limits. She returns for reevaluation   REVIEW OF SYSTEMS: Full 14 system review of systems performed and notable only for those listed, all others are neg:  Constitutional: neg  Cardiovascular: neg Ear/Nose/Throat: neg  Skin: neg Eyes: neg Respiratory: neg Gastroitestinal: neg  Hematology/Lymphatic: neg  Endocrine: neg Musculoskeletal: neg Allergy/Immunology: Environmental allergies Neurological: neg Psychiatric: neg Sleep : neg   ALLERGIES: Allergies  Allergen Reactions  . Dilantin [Phenytoin] Swelling    Swelling of lymph nodes    HOME MEDICATIONS: Outpatient Medications Prior to Visit  Medication Sig Dispense Refill  . carbamazepine (TEGRETOL) 200 MG tablet Take 1 tablet (200 mg total) by mouth 2 (two) times daily. 60 tablet 11  . cholecalciferol (VITAMIN D) 1000 UNITS tablet Take 1,000 Units by mouth daily.    . fish oil-omega-3 fatty acids 1000 MG capsule Take 2 g by mouth daily.    . hydrochlorothiazide (MICROZIDE) 12.5 MG capsule Take 1 capsule (12.5 mg total) by mouth daily. 90 capsule 3  . ferrous sulfate 325 (65 FE) MG tablet Take 325 mg by mouth daily as  needed (for defficiency).     No facility-administered medications prior to visit.     PAST MEDICAL HISTORY: Past Medical History:  Diagnosis Date  . Abnormal Pap smear 1999  . Anemia   . Asthma   . BV (bacterial vaginosis) 05/07/2013  . Carpal tunnel syndrome, bilateral 09/2014  . Complication of anesthesia    Takes a while to wake up  . Hyperlipidemia   . Seizures (HCC) 1990  . Vaginal discharge 05/07/2013  . Vaginal Pap smear, abnormal     PAST SURGICAL HISTORY: Past Surgical History:  Procedure Laterality Date  . BRAIN SURGERY  1990  . CESAREAN SECTION  2004  . COLONOSCOPY N/A 03/13/2013   Procedure: COLONOSCOPY;  Surgeon: Malissa Hippo, MD;  Location: [facility];  Service: Endoscopy;  Laterality: N/A;  830  . GYNECOLOGIC CRYOSURGERY  1999   Abnormal Pap  . TUBAL LIGATION      FAMILY HISTORY: Family History  Problem Relation Age of Onset  . Diabetes Mother   . Hypertension Mother   . Diabetes Father   . Heart disease Father   . Hypertension Father   . Heart disease Brother   . Diabetes Maternal Aunt   . Hypertension Maternal Aunt   . Diabetes Maternal Uncle   . Hypertension Maternal Uncle   . Colon cancer Neg Hx     SOCIAL HISTORY: Social History   Social History  . Marital status: Married    Spouse name: N/A  . Number of children: 2  . Years of education: college4   Occupational History  . Social Worker [employer]   Social History Main Topics  . Smoking status: Never Smoker  . Smokeless tobacco: Never Used  . Alcohol use No  . Drug use: No  . Sexual activity: Yes    Partners: Male    Birth control/ protection: Surgical, Post-menopausal     Comment: tubal   Other Topics Concern  . Not on file   Social History Narrative  . No narrative on file     PHYSICAL EXAM  Vitals:   10/27/15 1343  BP: 108/76  Pulse: 83  Weight: 167 lb (75.8 kg)  Height: 5\' 3"  (1.6 m)   Body mass index is 29.58  kg/m. Generalized: Well developed, in no acute distress  Head: normocephalic and atraumatic,. Oropharynx benign  Neck: Supple, no carotid bruits  Cardiac: Regular rate rhythm, no murmur  Musculoskeletal: No deformity  Neurological examination  Mentation: Alert oriented to time, place, history taking. Follows all commands speech and language fluent  Cranial nerve II-XII: Pupils were equal round reactive to light extraocular movements were full, visual field were full on confrontational test. Facial sensation and strength were normal. hearing was intact to finger rubbing bilaterally. Uvula tongue midline. head turning and shoulder shrug and were normal and symmetric.Tongue protrusion into cheek strength was normal.  Motor: normal bulk and tone, full strength in the BUE, BLE, fine finger movements normal, no pronator drift. No focal weakness, brace to the left wrist Coordination: finger-nose-finger, heel-to-shin bilaterally, no dysmetria  Reflexes: Brachioradialis 2/2, biceps 2/2, triceps 2/2, patellar 2/2, Achilles 2/2, plantar responses were flexor bilaterally.  Gait and Station: Rising up from seated position without assistance, normal stance, moderate stride, good arm swing, smooth turning, tandem gait steady   DIAGNOSTIC DATA (LABS, IMAGING, TESTING) - I reviewed patient records, labs, notes, testing and imaging myself where available.  Lab Results  Component Value Date   WBC 5.9 08/21/2014   HGB 11.0 (L) 01/08/2013   HCT 36.0 08/21/2014   MCV 86 08/21/2014   PLT 293 08/21/2014      Component Value Date/Time   NA 141 07/06/2015 1618   K 3.7 07/06/2015 1618   CL 99 07/06/2015 1618   CO2 25 07/06/2015 1618   GLUCOSE 89 07/06/2015 1618   BUN 12 07/06/2015 1618   CREATININE 0.75 07/06/2015 1618   CALCIUM 10.3 (H) 07/06/2015 1618   PROT 7.0 07/06/2015 1618   ALBUMIN 4.3 07/06/2015 1618   AST 21 07/06/2015 1618   ALT 18 07/06/2015 1618   ALKPHOS 122 (H) 07/06/2015 1618    BILITOT <0.2 07/06/2015 1618   GFRNONAA 91 07/06/2015 1618   GFRAA 105 07/06/2015 1618       ASSESSMENT AND PLAN  55 y.o. year old female  has a past medical history of Seizures (HCC) (1990);  and Carpal tunnel syndrome, bilateral (09/2014). here to follow-up.  Most recent CBZ level 6.8 on 07/06/15  CBC and CMP WNL. Continue Tegretol 200mg  twice daily will refill Call for seizure activity F/U yearly and prn Nilda RiggsNancy Carolyn Jiali Linney, [facility] WorthGNP, [facility] HospitalBC, APRN  [facility] [address], [address] [place], [place] [address] [phone]

## 2015-10-27 NOTE — Patient Instructions (Signed)
Most recent CBZ level 6.8 on 07/06/15  CBC and CMP WNL. Continue Tegretol 200mg  twice daily will refill Call for seizure activity F/U yearly and prn

## 2015-10-28 NOTE — Progress Notes (Signed)
I agree with the assessment and plan as directed by NP .The patient is known to me .   Cariana Karge, MD  

## 2015-10-28 NOTE — Progress Notes (Signed)
I agree with the assessment and plan as directed by NP .The patient is known to me .   Elyna Pangilinan, MD  

## 2015-12-28 ENCOUNTER — Ambulatory Visit (INDEPENDENT_AMBULATORY_CARE_PROVIDER_SITE_OTHER): Payer: BLUE CROSS/BLUE SHIELD | Admitting: Family Medicine

## 2015-12-28 ENCOUNTER — Encounter: Payer: Self-pay | Admitting: Family Medicine

## 2015-12-28 VITALS — BP 118/77 | HR 75 | Temp 97.2°F | Ht 63.0 in | Wt 171.1 lb

## 2015-12-28 DIAGNOSIS — L918 Other hypertrophic disorders of the skin: Secondary | ICD-10-CM | POA: Diagnosis not present

## 2015-12-28 NOTE — Progress Notes (Signed)
BP 118/77   Pulse 75   Temp 97.2 F (36.2 C) (Oral)   Ht 5\' 3"  (1.6 m)   Wt 171 lb 2 oz (77.6 kg)   BMI 30.31 kg/m    Subjective:    Patient ID: Suzanne Jimenez, female    DOB: 1960/06/01, 55 y.o.   MRN: [id number]  HPI: Suzanne Jimenez is a 55 y.o. female presenting on 12/28/2015 for Skin tag on neck (noticed on Saturday it had gotten larger and had fluid draining from it)   HPI Skin tag that is inflamed and larger Patient has a skin tag on her left neck that has been irritated and inflamed and more enlarged since she had her hair done the last time. She also says is been having some clear drainage out of it and is much larger than it has been previously. She has multiple other skin tags on her neck that are not bothering her like this one has. She also has multiple nevi on her body and she was just concerned because this when got so large. She denies any fevers or chills or redness or warmth but just that drainage.  Relevant past medical, surgical, family and social history reviewed and updated as indicated. Interim medical history since our last visit reviewed. Allergies and medications reviewed and updated.  Review of Systems  Constitutional: Negative for chills and fever.  Respiratory: Negative for chest tightness and shortness of breath.   Cardiovascular: Negative for chest pain and leg swelling.  Genitourinary: Negative for difficulty urinating and dysuria.  Musculoskeletal: Negative for back pain and gait problem.  Skin: Negative for color change and rash.  Neurological: Negative for light-headedness and headaches.  Psychiatric/Behavioral: Negative for agitation and behavioral problems.  All other systems reviewed and are negative.   Per HPI unless specifically indicated above      Objective:    BP 118/77   Pulse 75   Temp 97.2 F (36.2 C) (Oral)   Ht 5\' 3"  (1.6 m)   Wt 171 lb 2 oz (77.6 kg)   BMI 30.31 kg/m   Wt Readings from Last 3 Encounters:    12/28/15 171 lb 2 oz (77.6 kg)  10/27/15 167 lb (75.8 kg)  07/06/15 163 lb 6.4 oz (74.1 kg)    Physical Exam  Constitutional: She is oriented to person, place, and time. She appears well-developed and well-nourished. No distress.  Eyes: Conjunctivae are normal.  Musculoskeletal: Normal range of motion. She exhibits no edema or tenderness.  Neurological: She is alert and oriented to person, place, and time. Coordination normal.  Skin: Skin is warm and dry. Lesion (Pigmented pedunculated and enlarged skin tag with no erythema or warmth. Clear drainage out of it. Skin tag is elevated 0.2 cm and 0.2 cm wide.) noted. No rash noted. She is not diaphoretic.  Psychiatric: She has a normal mood and affect. Her behavior is normal.  Nursing note and vitals reviewed.  Skin lesion removal: Site was prepped with Betadine. 2% lidocaine with epinephrine was used for local anesthesia, 2mL. shave biopsy was performed achieving optimal depth and margins, Monsel's was used to help with hemostasis. then it was covered by 4 x 4 and tape told in place. Procedure was tolerated well and had minimal bleeding    Assessment & Plan:   Problem List Items Addressed This Visit    None    Visit Diagnoses    Inflamed skin tag    -  Primary   Relevant Orders  Pathology       Follow up plan: Return if symptoms worsen or fail to improve.  Counseling provided for all of the vaccine components No orders of the defined types were placed in this encounter.   Arville CareJoshua Shelly Shoultz, MD [facility] 12/28/2015, 5:08 PM

## 2015-12-31 LAB — PATHOLOGY

## 2016-03-16 ENCOUNTER — Ambulatory Visit (INDEPENDENT_AMBULATORY_CARE_PROVIDER_SITE_OTHER): Payer: BLUE CROSS/BLUE SHIELD | Admitting: Adult Health

## 2016-03-16 ENCOUNTER — Encounter: Payer: Self-pay | Admitting: Adult Health

## 2016-03-16 VITALS — BP 124/78 | HR 82 | Ht 61.25 in | Wt 170.5 lb

## 2016-03-16 DIAGNOSIS — Z1212 Encounter for screening for malignant neoplasm of rectum: Secondary | ICD-10-CM

## 2016-03-16 DIAGNOSIS — Z01419 Encounter for gynecological examination (general) (routine) without abnormal findings: Secondary | ICD-10-CM | POA: Diagnosis not present

## 2016-03-16 DIAGNOSIS — Z1211 Encounter for screening for malignant neoplasm of colon: Secondary | ICD-10-CM

## 2016-03-16 DIAGNOSIS — Z780 Asymptomatic menopausal state: Secondary | ICD-10-CM | POA: Diagnosis not present

## 2016-03-16 LAB — HEMOCCULT GUIAC POC 1CARD (OFFICE): FECAL OCCULT BLD: NEGATIVE

## 2016-03-16 NOTE — Progress Notes (Signed)
Patient ID: Suzanne Jimenez, female   DOB: 18-Jul-1960, 56 y.o.   MRN: [id number] History of Present Illness: Suzanne Jimenez is a 56 year old black female for a well woman gyn exam, she had a normal pap with negative HPV 10/08/14.Has not had a period in over a year, has some hot flashes. [facility]  Current Medications, Allergies, Past Medical History, Past Surgical History, Family History and Social History were [facility] electronic [facility].     Review of Systems:  Patient denies any headaches, hearing loss, fatigue, blurred vision, shortness of breath, chest pain, abdominal pain, problems with bowel movements, urination, or intercourse. No joint pain or mood swings. See HPI for positives.  Physical Exam:BP 124/78 (BP Location: Left Arm, Patient Position: Sitting, Cuff Size: Normal)   Pulse 82   Ht 5' 1.25" (1.556 m)   Wt 170 lb 8 oz (77.3 kg)   LMP 12/29/2013   BMI 31.95 kg/m  General:  Well developed, well nourished, no acute distress Skin:  Warm and dry Neck:  Midline trachea, normal thyroid, good ROM, no lymphadenopathy Lungs; Clear to auscultation bilaterally Breast:  No dominant palpable mass, retraction, or nipple discharge Cardiovascular: Regular rate and rhythm Abdomen:  Soft, non tender, no hepatosplenomegaly Pelvic:  External genitalia is normal in appearance, no lesions.  The vagina is normal in appearance. Urethra has no lesions or masses. The cervix is smooth.  Uterus is felt to be normal size, shape, and contour.  No adnexal masses or tenderness noted.Bladder is non tender, no masses felt. Rectal: Good sphincter tone, no polyps, or hemorrhoids felt.  Hemoccult negative. Extremities/musculoskeletal:  No swelling or varicosities noted, no clubbing or cyanosis Psych:  No mood changes, alert and cooperative,seems happy PHQ 2 score 0.Discussed if has any bleeding now to let me know, would need US to  assess.   Impression: 1. Well woman exam with routine gynecological exam   2. Screening for colorectal cancer   3. Postmenopausal       Plan: Physical and pap in 1 year Mammogram yearly Labs at work Colonoscopy per GI Review handout on menopause

## 2016-03-16 NOTE — Patient Instructions (Signed)
Menopause Menopause is the normal time of life when menstrual periods stop completely. Menopause is complete when you have missed 12 consecutive menstrual periods. It usually occurs between the ages of 48 years and 55 years. Very rarely does a woman develop menopause before the age of 40 years. At menopause, your ovaries stop producing the female hormones estrogen and progesterone. This can cause undesirable symptoms and also affect your health. Sometimes the symptoms may occur 4-5 years before the menopause begins. There is no relationship between menopause and:  Oral contraceptives.  Number of children you had.  Race.  The age your menstrual periods started (menarche).  Heavy smokers and very thin women may develop menopause earlier in life. What are the causes?  The ovaries stop producing the female hormones estrogen and progesterone. Other causes include:  Surgery to remove both ovaries.  The ovaries stop functioning for no known reason.  Tumors of the pituitary gland in the brain.  Medical disease that affects the ovaries and hormone production.  Radiation treatment to the abdomen or pelvis.  Chemotherapy that affects the ovaries.  What are the signs or symptoms?  Hot flashes.  Night sweats.  Decrease in sex drive.  Vaginal dryness and thinning of the vagina causing painful intercourse.  Dryness of the skin and developing wrinkles.  Headaches.  Tiredness.  Irritability.  Memory problems.  Weight gain.  Bladder infections.  Hair growth of the face and chest.  Infertility. More serious symptoms include:  Loss of bone (osteoporosis) causing breaks (fractures).  Depression.  Hardening and narrowing of the arteries (atherosclerosis) causing heart attacks and strokes.  How is this diagnosed?  When the menstrual periods have stopped for 12 straight months.  Physical exam.  Hormone studies of the blood. How is this treated? There are many treatment  choices and nearly as many questions about them. The decisions to treat or not to treat menopausal changes is an individual choice made with your health care provider. Your health care provider can discuss the treatments with you. Together, you can decide which treatment will work best for you. Your treatment choices may include:  Hormone therapy (estrogen and progesterone).  Non-hormonal medicines.  Treating the individual symptoms with medicine (for example antidepressants for depression).  Herbal medicines that may help specific symptoms.  Counseling by a psychiatrist or psychologist.  Group therapy.  Lifestyle changes including: ? Eating healthy. ? Regular exercise. ? Limiting caffeine and alcohol. ? Stress management and meditation.  No treatment.  Follow these instructions at home:  Take the medicine your health care provider gives you as directed.  Get plenty of sleep and rest.  Exercise regularly.  Eat a diet that contains calcium (good for the bones) and soy products (acts like estrogen hormone).  Avoid alcoholic beverages.  Do not smoke.  If you have hot flashes, dress in layers.  Take supplements, calcium, and vitamin D to strengthen bones.  You can use over-the-counter lubricants or moisturizers for vaginal dryness.  Group therapy is sometimes very helpful.  Acupuncture may be helpful in some cases. Contact a health care provider if:  You are not sure you are in menopause.  You are having menopausal symptoms and need advice and treatment.  You are still having menstrual periods after age 55 years.  You have pain with intercourse.  Menopause is complete (no menstrual period for 12 months) and you develop vaginal bleeding.  You need a referral to a specialist (gynecologist, psychiatrist, or psychologist) for treatment. Get help right   away if:  You have severe depression.  You have excessive vaginal bleeding.  You fell and think you have a  broken bone.  You have pain when you urinate.  You develop leg or chest pain.  You have a fast pounding heart beat (palpitations).  You have severe headaches.  You develop vision problems.  You feel a lump in your breast.  You have abdominal pain or severe indigestion. This information is not intended to replace advice given to you by your health care provider. Make sure you discuss any questions you have with your health care provider. Document Released: 03/19/2003 Document Revised: 06/04/2015 Document Reviewed: 07/26/2012 Elsevier Interactive Patient Education  2017 Elsevier Inc.  

## 2016-05-11 ENCOUNTER — Ambulatory Visit (INDEPENDENT_AMBULATORY_CARE_PROVIDER_SITE_OTHER): Payer: BLUE CROSS/BLUE SHIELD | Admitting: Adult Health

## 2016-05-11 ENCOUNTER — Encounter: Payer: Self-pay | Admitting: Adult Health

## 2016-05-11 VITALS — BP 114/70 | HR 75 | Ht 62.0 in | Wt 162.0 lb

## 2016-05-11 DIAGNOSIS — N950 Postmenopausal bleeding: Secondary | ICD-10-CM

## 2016-05-11 NOTE — Patient Instructions (Signed)
Postmenopausal Bleeding Postmenopausal bleeding is any bleeding a woman has after she has entered into menopause. Menopause is the end of a woman's fertile years. After menopause, a woman no longer ovulates or has menstrual periods. Postmenopausal bleeding can be caused by various things. Any type of postmenopausal bleeding, even if it appears to be a typical menstrual period, is concerning. This should be evaluated by your health care provider. Any treatment will depend on the cause of the bleeding. Follow these instructions at home: Monitor your condition for any changes. The following actions may help to alleviate any discomfort you are experiencing:  Avoid the use of tampons and douches as directed by your health care provider.  Change your pads frequently.  Get regular pelvic exams and Pap tests.  Keep all follow-up appointments for diagnostic tests as directed by your health care provider. Contact a health care provider if:  Your bleeding lasts more than 1 week.  You have abdominal pain.  You have bleeding with sexual intercourse. Get help right away if:  You have a fever, chills, headache, dizziness, muscle aches, and bleeding.  You have severe pain with bleeding.  You are passing blood clots.  You have bleeding and need more than 1 pad an hour.  You feel faint. This information is not intended to replace advice given to you by your health care provider. Make sure you discuss any questions you have with your health care provider. Document Released: 04/06/2005 Document Revised: 06/04/2015 Document Reviewed: 07/26/2012 Elsevier Interactive Patient Education  2017 Elsevier Inc.  

## 2016-05-11 NOTE — Progress Notes (Signed)
Subjective:     Patient ID: Suzanne Jimenez, female   DOB: 1960-07-17, 56 y.o.   MRN: [id number]  HPI Suzanne Jimenez is a 56 year old black female in complaining of vaginal bleeding since Sunday, no pain.Did go to theme park and did lots of working.  Review of Systems Vaginal bleeding since Sunday, no pain Reviewed past medical,surgical, social and family history. Reviewed medications and allergies.     Objective:   Physical Exam BP 114/70 (BP Location: Right Arm, Patient Position: Sitting, Cuff Size: Normal)   Pulse 75   Ht  (1.575 m)   Wt 162 lb (73.5 kg)   LMP 12/29/2013   BMI 29.63 kg/m    Skin warm and dry.Pelvic: external genitalia is normal in appearance no lesions, vagina: period like blood, with out any odor,urethra has no lesions or masses noted, cervix:smooth and bulbous, uterus: normal size, shape and contour, non tender, no masses felt, adnexa: no masses or tenderness noted. Bladder is non tender and no masses felt.  Discussed could be polyp or increased tissue and may need biopsy to rule out possible endometrial cancer.  Assessment:     1. PMB (postmenopausal bleeding)       Plan:    Return in 1 day for GYN [place] Review handout on PMB

## 2016-05-12 ENCOUNTER — Ambulatory Visit (INDEPENDENT_AMBULATORY_CARE_PROVIDER_SITE_OTHER): Payer: BLUE CROSS/BLUE SHIELD

## 2016-05-12 DIAGNOSIS — N950 Postmenopausal bleeding: Secondary | ICD-10-CM | POA: Diagnosis not present

## 2016-05-12 NOTE — Progress Notes (Signed)
PELVIC US TA/TV: heterogeneous anteverted uterus w/ a anterior right intramural fibroid 1.3 x 1.3 x 1.4 cm,thickened endometrium 9.2 mm,normal ovaries bilat,no free fluid,no pain during ultrasound

## 2016-05-13 ENCOUNTER — Telehealth: Payer: Self-pay | Admitting: Adult Health

## 2016-05-13 NOTE — Telephone Encounter (Signed)
Pt aware US shoed normal ovaries and thickened EEC 9.2 mm and uterus has fibroid, will get endometrial biopsy with Dr Despina HiddenEure.

## 2016-05-26 ENCOUNTER — Other Ambulatory Visit: Payer: Self-pay | Admitting: Obstetrics & Gynecology

## 2016-05-26 ENCOUNTER — Encounter: Payer: Self-pay | Admitting: Obstetrics & Gynecology

## 2016-05-26 ENCOUNTER — Ambulatory Visit (INDEPENDENT_AMBULATORY_CARE_PROVIDER_SITE_OTHER): Payer: BLUE CROSS/BLUE SHIELD | Admitting: Obstetrics & Gynecology

## 2016-05-26 VITALS — BP 118/70 | HR 88 | Ht 62.0 in | Wt 160.0 lb

## 2016-05-26 DIAGNOSIS — R938 Abnormal findings on diagnostic imaging of other specified body structures: Secondary | ICD-10-CM

## 2016-05-26 DIAGNOSIS — R9389 Abnormal findings on diagnostic imaging of other specified body structures: Secondary | ICD-10-CM

## 2016-05-26 DIAGNOSIS — N950 Postmenopausal bleeding: Secondary | ICD-10-CM | POA: Diagnosis not present

## 2016-05-26 NOTE — Progress Notes (Signed)
Endometrial Biopsy Procedure Note  Pre-operative Diagnosis: Thickened endometrium with post menopausal bleeding  Post-operative Diagnosis: same  Indications: thickened endometrium with postmenopausal bleeding  Procedure Details   Urine pregnancy test was not done.  The risks (including infection, bleeding, pain, and uterine perforation) and benefits of the procedure were explained to the patient and Written informed consent was obtained.  Antibiotic prophylaxis against endocarditis was not indicated.   The patient was placed in the dorsal lithotomy position.  Bimanual exam showed the uterus to be in the neutral position.  A Graves' speculum inserted in the vagina, and the cervix prepped with povidone iodine.  Endocervical curettage with a Kevorkian curette was not performed.   A sharp tenaculum was applied to the anterior lip of the cervix for stabilization.  A sterile uterine sound was used to sound the uterus to a depth of 6.5cm.  A pipelle curette was used to sample the endometrium.  Sample was sent for pathologic examination.  Condition: Stable  Complications: None  Plan:  The patient was advised to call for any fever or for prolonged or severe pain or bleeding. She was advised to use OTC ibuprofen as needed for mild to moderate pain. She was advised to avoid vaginal intercourse for 48 hours or until the bleeding has completely stopped.  Attending Physician Documentation: I was present for or performed the following: endometrial biopsy

## 2016-05-26 NOTE — Addendum Note (Signed)
Addended by: Gaylyn RongEVANS, Jadriel Saxer A on: 05/26/2016 04:13 PM   Modules accepted: Orders

## 2016-06-03 ENCOUNTER — Encounter: Payer: Self-pay | Admitting: Obstetrics & Gynecology

## 2016-06-03 ENCOUNTER — Ambulatory Visit (INDEPENDENT_AMBULATORY_CARE_PROVIDER_SITE_OTHER): Payer: BLUE CROSS/BLUE SHIELD | Admitting: Obstetrics & Gynecology

## 2016-06-03 VITALS — BP 100/52 | HR 68 | Wt 158.0 lb

## 2016-06-03 DIAGNOSIS — N950 Postmenopausal bleeding: Secondary | ICD-10-CM

## 2016-06-03 NOTE — Progress Notes (Signed)
Follow up appointment for results  Chief Complaint  Patient presents with  . Follow-up    Blood pressure (!) 100/52, pulse 68, weight 158 lb (71.7 kg), last menstrual period 12/29/2013.    Endometrial biopsy atrophic, benign (not proliferative)  MEDS ordered this encounter: No orders of the defined types were placed in this encounter.   Orders for this encounter: No orders of the defined types were placed in this encounter.   Impression: PMB, benign pathology  Plan: If continues to bleed will use low dose progesterone, if no bleeding no therapy needed  Follow Up: Return in about 1 year (around 06/03/2017).       Face to face time:  10 minutes  Greater than 50% of the visit time was spent in counseling and coordination of care with the patient.  The summary and outline of the counseling and care coordination is summarized in the note above.   All questions were answered.  Past Medical History:  Diagnosis Date  . Abnormal Pap smear 1999  . Anemia   . Asthma   . BV (bacterial vaginosis) 05/07/2013  . Complication of anesthesia    Takes a while to wake up  . Hyperlipidemia   . Seizures (HCC) 1990  . Vaginal discharge 05/07/2013  . Vaginal Pap smear, abnormal     Past Surgical History:  Procedure Laterality Date  . BRAIN SURGERY  1990  . CESAREAN SECTION  2004  . COLONOSCOPY N/A 03/13/2013   Procedure: COLONOSCOPY;  Surgeon: Malissa HippoNajeeb U Rehman, MD;  [facility];  Service: Endoscopy;  Laterality: N/A;  830  . GYNECOLOGIC CRYOSURGERY  1999   Abnormal Pap  . TUBAL LIGATION      OB History    Gravida Para Term Preterm AB Living   3 2 2  0 1 2   SAB TAB Ectopic Multiple Live Births   0 1 0 0 2      Allergies  Allergen Reactions  . Dilantin [Phenytoin] Swelling    Swelling of lymph nodes    Social History   Social History  . Marital status: Married    Spouse name: N/A  . Number of children: 2  . Years of education: college4    Occupational History  [place] [employer]    [place]   Social History Main Topics  . Smoking status: Never Smoker  . Smokeless tobacco: Never Used  . Alcohol use No  . Drug use: No  . Sexual activity: Yes    Partners: Male    Birth control/ protection: Surgical, Post-menopausal     Comment: tubal   Other Topics Concern  . None   Social History Narrative  . None    Family History  Problem Relation Age of Onset  . Diabetes Mother   . Hypertension Mother   . Diabetes Father   . Heart disease Father   . Hypertension Father   . Heart disease Brother   . Diabetes Maternal Aunt   . Hypertension Maternal Aunt   . Diabetes Maternal Uncle   . Hypertension Maternal Uncle   . Colon cancer Neg Hx

## 2016-10-26 ENCOUNTER — Encounter: Payer: Self-pay | Admitting: Nurse Practitioner

## 2016-10-26 ENCOUNTER — Ambulatory Visit (INDEPENDENT_AMBULATORY_CARE_PROVIDER_SITE_OTHER): Payer: Commercial Managed Care - PPO | Admitting: Nurse Practitioner

## 2016-10-26 VITALS — BP 106/68 | HR 72 | Ht 62.0 in | Wt 164.8 lb

## 2016-10-26 DIAGNOSIS — G40109 Localization-related (focal) (partial) symptomatic epilepsy and epileptic syndromes with simple partial seizures, not intractable, without status epilepticus: Secondary | ICD-10-CM

## 2016-10-26 MED ORDER — CARBAMAZEPINE 200 MG PO TABS: 200.0000 mg | ORAL_TABLET | Freq: Two times a day (BID) | ORAL | 11 refills | Status: DC

## 2016-10-26 NOTE — Patient Instructions (Signed)
Most recent CBZ level 9.2 09/05/16 CMP WNL. Continue Tegretol 200mg  twice daily will refill Call for seizure activity F/U yearly and prn

## 2016-10-26 NOTE — Progress Notes (Signed)
[facility]  PATIENT: Suzanne Jimenez DOB: [birth year]   REASON FOR VISIT: Follow-up for seizure disorder HISTORY FROM: Patient    HISTORY OF PRESENT ILLNESS:Suzanne Jimenez, 56 -year-old female returns today for yearly followup. 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 She had surgical removal of the AV malformation on the left and seizures have been under good control since that time. She is currently on Tegretol 200mg  twice daily. No seizure activity in 21 years .She denies any side effects to the medication, no daytime drowsiness, no feelings of being off balance. She denies missing any doses. She denies any headache. She has had no falls. Appetite is good, sleeping well. No new neurologic complaints. Recent CBZ level was  9.2 on 09/05/16.  CMP within normal limits. She returns for reevaluation   REVIEW OF SYSTEMS: Full 14 system review of systems performed and notable only for those listed, all others are neg:  Constitutional: neg  Cardiovascular: neg Ear/Nose/Throat: neg  Skin: neg Eyes: neg Respiratory: neg Gastroitestinal: neg  Hematology/Lymphatic: neg  Endocrine: neg Musculoskeletal: neg Allergy/Immunology: Environmental allergies Neurological: History of seizure disorder Psychiatric: neg Sleep : neg   ALLERGIES: Allergies  Allergen Reactions  . Dilantin [Phenytoin] Swelling    Swelling of lymph nodes    HOME MEDICATIONS: Outpatient Medications Prior to Visit  Medication Sig Dispense Refill  . carbamazepine (TEGRETOL) 200 MG tablet Take 1 tablet (200 mg total) by mouth 2 (two) times daily. 60 tablet 11  . cholecalciferol (VITAMIN D) 1000 units tablet Take 1,000 Units by mouth daily.    . ferrous sulfate 325 (65 FE) MG tablet Take 325 mg by mouth daily with breakfast.    . fish oil-omega-3 fatty acids 1000 MG capsule Take 2 g by mouth daily.    . Multiple Vitamins-Minerals (MULTIVITAMIN ADULTS) TABS Take by mouth daily.       . hydrochlorothiazide (MICROZIDE) 12.5 MG capsule Take 1 capsule (12.5 mg total) by mouth daily. (Patient not taking: Reported on 10/26/2016) 90 capsule 3   No facility-administered medications prior to visit.     PAST MEDICAL HISTORY: Past Medical History:  Diagnosis Date  . Abnormal Pap smear 1999  . Anemia   . Asthma   . BV (bacterial vaginosis) 05/07/2013  . Complication of anesthesia    Takes a while to wake up  . Hyperlipidemia   . Seizures (HCC) 1990  . Vaginal discharge 05/07/2013  . Vaginal Pap smear, abnormal     PAST SURGICAL HISTORY: Past Surgical History:  Procedure Laterality Date  . BRAIN SURGERY  1990  . CESAREAN SECTION  2004  . COLONOSCOPY N/A 03/13/2013   Procedure: COLONOSCOPY;  Surgeon: Malissa HippoNajeeb U Rehman, MD;  Location: [facility];  Service: Endoscopy;  Laterality: N/A;  830  . GYNECOLOGIC CRYOSURGERY  1999   Abnormal Pap  . TUBAL LIGATION      FAMILY HISTORY: Family History  Problem Relation Age of Onset  . Diabetes Mother   . Hypertension Mother   . Diabetes Father   . Heart disease Father   . Hypertension Father   . Heart disease Brother   . Diabetes Maternal Aunt   . Hypertension Maternal Aunt   . Diabetes Maternal Uncle   . Hypertension Maternal Uncle   . Colon cancer Neg Hx     SOCIAL HISTORY: Social History   Social History  . Marital status: Married    Spouse name: N/A  . Number of children: 2  .  Years of education: college4   Occupational History  . Social Worker [employer]   Social History Main Topics  . Smoking status: Never Smoker  . Smokeless tobacco: Never Used  . Alcohol use No  . Drug use: No  . Sexual activity: Yes    Partners: Male    Birth control/ protection: Surgical, Post-menopausal     Comment: tubal   Other Topics Concern  . Not on file   Social History Narrative  . No narrative on file     PHYSICAL EXAM  Vitals:   10/26/16 1414  BP: 106/68  Pulse: 72  Weight:  164 lb 12.8 oz (74.8 kg)  Height: 5\' 2"  (1.575 m)   Body mass index is 30.14 kg/m. Generalized: Well developed, in no acute distress  Head: normocephalic and atraumatic,. Oropharynx benign  Neck: Supple,  Musculoskeletal: No deformity  Neurological examination  Mentation: Alert oriented to time, place, history taking. Follows all commands speech and language fluent  Cranial nerve II-XII: Pupils were equal round reactive to light extraocular movements were full, visual field were full on confrontational test. Facial sensation and strength were normal. hearing was intact to finger rubbing bilaterally. Uvula tongue midline. head turning and shoulder shrug and were normal and symmetric.Tongue protrusion into cheek strength was normal.  Motor: normal bulk and tone, full strength in the BUE, BLE, fine finger movements normal, no pronator drift.  Coordination: finger-nose-finger, heel-to-shin bilaterally, no dysmetria  Reflexes: Brachioradialis 2/2, biceps 2/2, triceps 2/2, patellar 2/2, Achilles 2/2, plantar responses were flexor bilaterally.  Gait and Station: Rising up from seated position without assistance, normal stance, moderate stride, good arm swing, smooth turning, tandem gait steady   DIAGNOSTIC DATA (LABS, IMAGING, TESTING) - I reviewed patient records, labs, notes, testing and imaging myself where available.  Lab Results  Component Value Date   WBC 5.9 08/21/2014   HGB 12.0 08/21/2014   HCT 36.0 08/21/2014   MCV 86 08/21/2014   PLT 293 08/21/2014      Component Value Date/Time   NA 141 07/06/2015 1618   K 3.7 07/06/2015 1618   CL 99 07/06/2015 1618   CO2 25 07/06/2015 1618   GLUCOSE 89 07/06/2015 1618   BUN 12 07/06/2015 1618   CREATININE 0.75 07/06/2015 1618   CALCIUM 10.3 (H) 07/06/2015 1618   PROT 7.0 07/06/2015 1618   ALBUMIN 4.3 07/06/2015 1618   AST 21 07/06/2015 1618   ALT 18 07/06/2015 1618   ALKPHOS 122 (H) 07/06/2015 1618   BILITOT <0.2 07/06/2015  1618   GFRNONAA 91 07/06/2015 1618   GFRAA 105 07/06/2015 1618       ASSESSMENT AND PLAN  56 y.o. year old female  has a past medical history of Seizures (HCC) (1990);  and Carpal tunnel syndrome, bilateral (09/2014). here to follow-up. Seizure disorder is in good control. She is wondering if she can stop her seizure medication however once told that she cannot drive for 6 months she decided to stay on the medication. She also has a history of AV malformation on the left  that was surgically removed.  Most recent CBZ level (9.2) 09/05/16 CBC WNL. Continue Tegretol 200mg  twice daily will refill Call for seizure activity F/U yearly and prn Nilda Riggs, [facility], [facility], APRN  [facility] [address] [place], [place] [address] [phone]

## 2016-10-27 NOTE — Progress Notes (Signed)
I agree with the assessment and plan as directed by NP .The patient is followed by NP Daphine DeutscherMartin .   Faruq Rosenberger, MD

## 2017-04-17 ENCOUNTER — Encounter: Payer: Self-pay | Admitting: Family Medicine

## 2017-05-19 ENCOUNTER — Encounter: Payer: Self-pay | Admitting: Family Medicine

## 2017-05-19 ENCOUNTER — Ambulatory Visit (INDEPENDENT_AMBULATORY_CARE_PROVIDER_SITE_OTHER): Payer: Commercial Managed Care - PPO | Admitting: Family Medicine

## 2017-05-19 VITALS — BP 131/87 | HR 85 | Temp 98.8°F | Ht 62.0 in | Wt 164.0 lb

## 2017-05-19 DIAGNOSIS — Z833 Family history of diabetes mellitus: Secondary | ICD-10-CM | POA: Diagnosis not present

## 2017-05-19 DIAGNOSIS — G40109 Localization-related (focal) (partial) symptomatic epilepsy and epileptic syndromes with simple partial seizures, not intractable, without status epilepticus: Secondary | ICD-10-CM

## 2017-05-19 DIAGNOSIS — Z6830 Body mass index (BMI) 30.0-30.9, adult: Secondary | ICD-10-CM | POA: Diagnosis not present

## 2017-05-19 DIAGNOSIS — L821 Other seborrheic keratosis: Secondary | ICD-10-CM | POA: Diagnosis not present

## 2017-05-19 DIAGNOSIS — L819 Disorder of pigmentation, unspecified: Secondary | ICD-10-CM

## 2017-05-19 DIAGNOSIS — R635 Abnormal weight gain: Secondary | ICD-10-CM | POA: Diagnosis not present

## 2017-05-19 DIAGNOSIS — Z131 Encounter for screening for diabetes mellitus: Secondary | ICD-10-CM

## 2017-05-19 DIAGNOSIS — Z8349 Family history of other endocrine, nutritional and metabolic diseases: Secondary | ICD-10-CM

## 2017-05-19 LAB — BAYER DCA HB A1C WAIVED: HB A1C (BAYER DCA - WAIVED): 6.2 % (ref <7.0)

## 2017-05-19 NOTE — Progress Notes (Signed)
Subjective: CC: seizure disorder PCP: Elenora Gamma, MD [id number] Z Bassinger is a 57 y.o. female presenting to clinic today for:  1. Seizure disorder Patient followed by neurology for seizure disorder.  Last office visit was 10/26/2016.  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  She actually has had good control seizures since resection of left-sided AV malformation per neurology note.  She is treated with Tegretol 200 mg twice daily.  Last seizure episode was over 21 years ago.  Patient would like to have a Tegretol level today if possible.  2.  Weight gain/ FamHx DM, CAD, thyroid disease Patient reports that she has had difficulty maintaining weight for some time now.  She reports that she was actually enrolled in a diabetes class and has lost about 15 pounds but she gains it right back.  She notes that her weight tends to fluctuate.  She has not been exercising as regularly as she had been, she does plan to restart this.  She notes that she tends to eat pretty well during the day but at nighttime, she will reach for things that are quick and easy, particularly fast food.  This is also the case sometimes on the weekends.  She does have a strong family history of type 2 diabetes and would like to be tested for this today.  She notes that she had hemoglobin A1c checked at work and this was 6.2.  She does report good amounts of urination but she thinks this is because she drinks a lot of water during the day.  Denies any visual disturbance, numbness and tingling in the feet.  She does note occasional lower extremity edema.  Family history also significant for thyroid disorder and her father and sister.  There is a history of early cardiovascular disease in her father, who sustained an MI in his 28s.  Her brother had CVA in his 61s.  She notes that she had a stress test done about 5 years ago and was told that this looks fine.  Denies any chest pain, shortness of breath.  She does not  wish to have pharmacologic weight loss at this time.  She would like to work on lifestyle modifications.  3.  Spots on her back and her leg Patient also notes that she has had multiple moles on her back, which she would like to have evaluated.  She reports that she had a mole removed about 5 years ago by dermatology in [place].  No known personal history or family history of skin cancers.  She also has a lesion on her left posterior leg, which her sister recommended she have checked out.  She denies changes in color, size, shape or texture.  No spontaneous bleeding.  Lesion does not bother her particularly but she wanted to have it looked at.  ROS: Per HPI  Allergies  Allergen Reactions  . Dilantin [Phenytoin] Swelling    Swelling of lymph nodes   Past Medical History:  Diagnosis Date  . Abnormal Pap smear 1999  . Anemia   . Asthma   . BV (bacterial vaginosis) 05/07/2013  . Complication of anesthesia    Takes a while to wake up  . Hyperlipidemia   . Seizures (HCC) 1990  . Vaginal discharge 05/07/2013  . Vaginal Pap smear, abnormal     Current Outpatient Medications:  .  carbamazepine (TEGRETOL) 200 MG tablet, Take 1 tablet (200 mg total) by mouth 2 (two) times daily., Disp: 60 tablet, Rfl:  11 .  cetirizine (ZYRTEC) 10 MG tablet, Take 10 mg by mouth daily., Disp: , Rfl:  .  ferrous sulfate 325 (65 FE) MG tablet, Take 325 mg by mouth daily with breakfast., Disp: , Rfl:  .  fish oil-omega-3 fatty acids 1000 MG capsule, Take 2 g by mouth daily., Disp: , Rfl:  .  cholecalciferol (VITAMIN D) 1000 units tablet, Take 1,000 Units by mouth daily., Disp: , Rfl:  Social History   Socioeconomic History  . Marital status: Married    Spouse name: Not on file  . Number of children: 2  . Years of education: college4  . Highest education level: Not on file  Occupational History  . Occupation: Hospital doctor: Tour manager    Comment: [employer]  Social Needs  . Financial  resource strain: Not on file  . Food insecurity:    Worry: Not on file    Inability: Not on file  . Transportation needs:    Medical: Not on file    Non-medical: Not on file  Tobacco Use  . Smoking status: Never Smoker  . Smokeless tobacco: Never Used  Substance and Sexual Activity  . Alcohol use: No  . Drug use: No  . Sexual activity: Yes    Partners: Male    Birth control/protection: Surgical, Post-menopausal    Comment: tubal  Lifestyle  . Physical activity:    Days per week: Not on file    Minutes per session: Not on file  . Stress: Not on file  Relationships  . Social connections:    Talks on phone: Not on file    Gets together: Not on file    Attends religious service: Not on file    Active member of club or organization: Not on file    Attends meetings of clubs or organizations: Not on file    Relationship status: Not on file  . Intimate partner violence:    Fear of current or ex partner: Not on file    Emotionally abused: Not on file    Physically abused: Not on file    Forced sexual activity: Not on file  Other Topics Concern  . Not on file  Social History Narrative  . Not on file   Family History  Problem Relation Age of Onset  . Diabetes Mother   . Hypertension Mother   . Diabetes Father   . Heart disease Father   . Hypertension Father   . Heart disease Brother   . Diabetes Maternal Aunt   . Hypertension Maternal Aunt   . Diabetes Maternal Uncle   . Hypertension Maternal Uncle   . Colon cancer Neg Hx     Objective: Office vital signs reviewed. BP 131/87   Pulse 85   Temp 98.8 F (37.1 C) (Oral)   Ht  (1.575 m)   Wt 164 lb (74.4 kg)   LMP 12/29/2013   BMI 30.00 kg/m   Physical Examination:  General: Awake, alert, well nourished, obese. No acute distress HEENT: Normal    Neck: No masses palpated. No lymphadenopathy; no goiter    Eyes: PERRLA, extraocular membranes intact, sclera white; no exophthalmos Cardio: regular rate and  rhythm, S1S2 heard, no murmurs appreciated Pulm: clear to auscultation bilaterally, no wheezes, rhonchi or rales; normal work of breathing on room air Extremities: warm, well perfused, No edema, cyanosis or clubbing; +2 pulses bilaterally MSK: normal gait and normal station Skin: dry; intact; multiple seborrheic keratoses appreciated along  the back.  She has a 3-1/2 mm pigmented skin lesion that is well-rounded on the left inner posterior thigh.  Color is uniform. Neuro: Follows all commands.  No focal neurologic deficits.  Assessment/ Plan: 57 y.o. female   1. 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  She has a neurologist.  Will check Tegretol level per her report and send results to her neurologist, who she plans to see soon. - Carbamazepine level, total  2. Screening for diabetes mellitus (DM) Risk factors include family history of type 2 diabetes and obesity.  We discussed that lifestyle modification would most certainly be beneficial.  We did discuss pharmacologic options for weight loss but patient would like to hold off on this for now. - Bayer DCA Hb A1c Waived  3. Family history of diabetes mellitus (DM) - Bayer DCA Hb A1c Waived  4. Adult BMI 30.0-30.9 kg/sq m Handout provided with my plate recommendations for women's diet modification and increasing physical activity - TSH  5. Weight gain Likely secondary to dietary choices.  Will check TSH to rule out thyroid disease given family history of thyroid disease - TSH  6. Family history of thyroid disease - TSH  7. Seborrheic keratosis Lesions on her back are consistent with seborrheic keratoses.  She has multiple nevi on the upper extremities.  I did recommend that she consider scheduling appointment with dermatology for full body evaluation given the number of nevi.  I offered to place referral but patient wished to call the dermatologist in [place] and check  with her insurance before scheduling anything.  She will contact me if she needs a referral.  8. Pigmented skin lesion of uncertain nature Left inner thigh with a pigmented lesion that is approximately 3-1/2 mm in size.  Dermatology as above.   Orders Placed This Encounter  Procedures  . Bayer DCA Hb A1c Waived  . Carbamazepine level, total  . TSH    Keo Schirmer Hulen Skains, DO [facility] [phone]

## 2017-05-19 NOTE — Patient Instructions (Signed)
You had labs performed today.  You will be contacted with the results of the labs once they are available, usually in the next 3 days for routine lab work.     

## 2017-05-20 LAB — TSH: TSH: 1.97 u[IU]/mL (ref 0.450–4.500)

## 2017-05-20 LAB — CARBAMAZEPINE LEVEL, TOTAL: Carbamazepine (Tegretol), S: 6.6 ug/mL (ref 4.0–12.0)

## 2017-06-06 ENCOUNTER — Telehealth: Payer: Self-pay | Admitting: *Deleted

## 2017-06-06 NOTE — Telephone Encounter (Signed)
Received fax from [facility] pharmacy stating "patient has been on Epitol, can we change to [place] brand carbamazepine? Epitol on back order"  Per Enid Skeens, NP, [facility] may make that change. NP signed fax, successfully faxed back to [facility], [place], [place].

## 2017-10-30 NOTE — Progress Notes (Signed)
[facility]  PATIENT: Suzanne Jimenez DOB: [birth year]   REASON FOR VISIT: Follow-up for seizure disorder HISTORY FROM: Patient    HISTORY OF PRESENT ILLNESS:Suzanne Jimenez, 57 -year-old female returns today for yearly followup. 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 She had surgical removal of the AV malformation on the left and seizures have been under good control since that time. She is currently on Tegretol 200mg  twice daily. No seizure activity in 22 years .She denies any side effects to the medication, no daytime drowsiness, no feelings of being off balance. She denies missing any doses. She denies any headache. She has had no falls. Appetite is good, sleeping well. No new neurologic complaints. Recent CBZ level was 6.6 on 05/19/17.    She returns for reevaluation.  She has just retired.   REVIEW OF SYSTEMS: Full 14 system review of systems performed and notable only for those listed, all others are neg:  Constitutional: neg  Cardiovascular: neg Ear/Nose/Throat: neg  Skin: neg Eyes: neg Respiratory: neg Gastroitestinal: neg  Hematology/Lymphatic: neg  Endocrine: neg Musculoskeletal: neg Allergy/Immunology: Environmental allergies Neurological: History of seizure disorder Psychiatric: neg Sleep : neg   ALLERGIES: Allergies  Allergen Reactions  . Dilantin [Phenytoin] Swelling    Swelling of lymph nodes    HOME MEDICATIONS: Outpatient Medications Prior to Visit  Medication Sig Dispense Refill  . carbamazepine (TEGRETOL) 200 MG tablet Take 1 tablet (200 mg total) by mouth 2 (two) times daily. 60 tablet 11  . cetirizine (ZYRTEC) 10 MG tablet Take 10 mg by mouth daily.    . cholecalciferol (VITAMIN D) 1000 units tablet Take 1,000 Units by mouth daily.    . ferrous sulfate 325 (65 FE) MG tablet Take 325 mg by mouth daily with breakfast.    . fish oil-omega-3 fatty acids 1000 MG capsule Take 2 g by mouth daily.     No  facility-administered medications prior to visit.     PAST MEDICAL HISTORY: Past Medical History:  Diagnosis Date  . Abnormal Pap smear 1999  . Anemia   . Asthma   . BV (bacterial vaginosis) 05/07/2013  . Complication of anesthesia    Takes a while to wake up  . Hyperlipidemia   . Seizures (HCC) 1990  . Vaginal discharge 05/07/2013  . Vaginal Pap smear, abnormal     PAST SURGICAL HISTORY: Past Surgical History:  Procedure Laterality Date  . BRAIN SURGERY  1990  . CESAREAN SECTION  2004  . COLONOSCOPY N/A 03/13/2013   Procedure: COLONOSCOPY;  Surgeon: Malissa Hippo, MD;  Location: [facility];  Service: Endoscopy;  Laterality: N/A;  830  . GYNECOLOGIC CRYOSURGERY  1999   Abnormal Pap  . TUBAL LIGATION      FAMILY HISTORY: Family History  Problem Relation Age of Onset  . Diabetes Mother   . Hypertension Mother   . Diabetes Father   . Heart disease Father   . Hypertension Father   . Heart disease Brother   . Diabetes Maternal Aunt   . Hypertension Maternal Aunt   . Diabetes Maternal Uncle   . Hypertension Maternal Uncle   . Colon cancer Neg Hx     SOCIAL HISTORY: Social History   Socioeconomic History  . Marital status: Married    Spouse name: Not on file  . Number of children: 2  . Years of education: college4  . Highest education level: Not on file  Occupational History  . Occupation: Child psychotherapist  Employer: [employer]    Comment: [employer]  Social Needs  . Financial resource strain: Not on file  . Food insecurity:    Worry: Not on file    Inability: Not on file  . Transportation needs:    Medical: Not on file    Non-medical: Not on file  Tobacco Use  . Smoking status: Never Smoker  . Smokeless tobacco: Never Used  Substance and Sexual Activity  . Alcohol use: No  . Drug use: No  . Sexual activity: Yes    Partners: Male    Birth control/protection: Surgical, Post-menopausal    Comment: tubal  Lifestyle  . Physical  activity:    Days per week: Not on file    Minutes per session: Not on file  . Stress: Not on file  Relationships  . Social connections:    Talks on phone: Not on file    Gets together: Not on file    Attends religious service: Not on file    Active member of club or organization: Not on file    Attends meetings of clubs or organizations: Not on file    Relationship status: Not on file  . Intimate partner violence:    Fear of current or ex partner: Not on file    Emotionally abused: Not on file    Physically abused: Not on file    Forced sexual activity: Not on file  Other Topics Concern  . Not on file  Social History Narrative  . Not on file     PHYSICAL EXAM  Vitals:   10/31/17 1402  BP: 116/78  Pulse: 83  Weight: 192 lb 6.4 oz (87.3 kg)  Height: 5\' 2"  (1.575 m)   Body mass index is 35.19 kg/m. Generalized: Well developed, in no acute distress  Head: normocephalic and atraumatic,. Oropharynx benign  Neck: Supple,  Musculoskeletal: No deformity  Neurological examination  Mentation: Alert oriented to time, place, history taking. Follows all commands speech and language fluent  Cranial nerve II-XII: Pupils were equal round reactive to light extraocular movements were full, visual field were full on confrontational test. Facial sensation and strength were normal. hearing was intact to finger rubbing bilaterally. Uvula tongue midline. head turning and shoulder shrug and were normal and symmetric.Tongue protrusion into cheek strength was normal.  Motor: normal bulk and tone, full strength in the BUE, BLE, fine finger movements normal, no pronator drift.  Coordination: finger-nose-finger, heel-to-shin bilaterally, no dysmetria  Reflexes: Brachioradialis 2/2, biceps 2/2, triceps 2/2, patellar 2/2, Achilles 2/2, plantar responses were flexor bilaterally.  Gait and Station: Rising up from seated position without assistance, normal stance, moderate stride, good arm swing,  smooth turning, tandem gait steady   DIAGNOSTIC DATA (LABS, IMAGING, TESTING) - I reviewed patient records, labs, notes, testing and imaging myself where available.  Lab Results  Component Value Date   WBC 5.9 08/21/2014   HGB 12.0 08/21/2014   HCT 36.0 08/21/2014   MCV 86 08/21/2014   PLT 293 08/21/2014      Component Value Date/Time   NA 141 07/06/2015 1618   K 3.7 07/06/2015 1618   CL 99 07/06/2015 1618   CO2 25 07/06/2015 1618   GLUCOSE 89 07/06/2015 1618   BUN 12 07/06/2015 1618   CREATININE 0.75 07/06/2015 1618   CALCIUM 10.3 (H) 07/06/2015 1618   PROT 7.0 07/06/2015 1618   ALBUMIN 4.3 07/06/2015 1618   AST 21 07/06/2015 1618   ALT 18 07/06/2015 1618   ALKPHOS  122 (H) 07/06/2015 1618   BILITOT <0.2 07/06/2015 1618   GFRNONAA 91 07/06/2015 1618   GFRAA 105 07/06/2015 1618       ASSESSMENT AND PLAN  57 y.o. year old female  has a past medical history of Seizures (HCC) (1990);  and Carpal tunnel syndrome, bilateral (09/2014). here to follow-up. Seizure disorder is in good control.  She also has a history of AV malformation on the left  that was surgically removed.  Most recent CBZ level 6.6 on 05/19/17 Continue Tegretol 200mg  twice daily will refill Call for seizure activity F/U yearly and prn Nilda Riggs, [facility], [facility], APRN  [facility] [address], [address] [place], [place] [address] [phone]

## 2017-10-31 ENCOUNTER — Ambulatory Visit (INDEPENDENT_AMBULATORY_CARE_PROVIDER_SITE_OTHER): Payer: Commercial Managed Care - PPO | Admitting: Nurse Practitioner

## 2017-10-31 ENCOUNTER — Encounter: Payer: Self-pay | Admitting: Nurse Practitioner

## 2017-10-31 VITALS — BP 116/78 | HR 83 | Ht 62.0 in | Wt 192.4 lb

## 2017-10-31 DIAGNOSIS — G40109 Localization-related (focal) (partial) symptomatic epilepsy and epileptic syndromes with simple partial seizures, not intractable, without status epilepticus: Secondary | ICD-10-CM

## 2017-10-31 MED ORDER — CARBAMAZEPINE 200 MG PO TABS
200.0000 mg | ORAL_TABLET | Freq: Two times a day (BID) | ORAL | 3 refills | Status: DC
Start: 2017-10-31 — End: 2018-12-10

## 2017-10-31 NOTE — Patient Instructions (Signed)
Most recent CBZ level 6.6 on 05/19/17 Continue Tegretol 200mg  twice daily will refill Call for seizure activity F/U yearly and prn

## 2017-11-08 ENCOUNTER — Other Ambulatory Visit: Payer: Self-pay | Admitting: Family Medicine

## 2017-11-09 ENCOUNTER — Encounter: Payer: Self-pay | Admitting: Family Medicine

## 2017-11-09 ENCOUNTER — Ambulatory Visit (INDEPENDENT_AMBULATORY_CARE_PROVIDER_SITE_OTHER): Payer: Commercial Managed Care - PPO | Admitting: Family Medicine

## 2017-11-09 VITALS — BP 117/75 | HR 86 | Temp 98.4°F | Ht 62.0 in | Wt 192.0 lb

## 2017-11-09 DIAGNOSIS — Z131 Encounter for screening for diabetes mellitus: Secondary | ICD-10-CM | POA: Diagnosis not present

## 2017-11-09 DIAGNOSIS — Z6835 Body mass index (BMI) 35.0-35.9, adult: Secondary | ICD-10-CM | POA: Diagnosis not present

## 2017-11-09 DIAGNOSIS — E119 Type 2 diabetes mellitus without complications: Secondary | ICD-10-CM | POA: Diagnosis not present

## 2017-11-09 LAB — BAYER DCA HB A1C WAIVED: HB A1C (BAYER DCA - WAIVED): 6.6 % (ref <7.0)

## 2017-11-09 MED ORDER — METFORMIN HCL ER 500 MG PO TB24: 500.0000 mg | ORAL_TABLET | Freq: Every day | ORAL | 3 refills | Status: DC

## 2017-11-09 MED ORDER — BLOOD GLUCOSE METER KIT: PACK | 0 refills | Status: AC

## 2017-11-09 NOTE — Progress Notes (Signed)
Subjective:    Patient ID: Suzanne Jimenez, female    DOB: September 05, 1960, 57 y.o.   MRN: [id number]  Chief Complaint:  A1C check (concerned because both parents are diabetics) and Weight Gain   HPI: Suzanne Jimenez is a 57 y.o. female presenting on 11/09/2017 for A1C check (concerned because both parents are diabetics) and Weight Gain  Pt presents today for diabetes screening and weight gain. Pt was seen on 05/19/17 and her A1C was 6.2. Lifestyle modifications with diet and exercise were initiated. Pt states even with diet and exercise she has gained about 30 pounds. Pt states she tries to limit her sugary snack and soda intake. States she does have sodas on occasion. She has not checked her sugars at home. She states she has experienced increased appetite, thirst, and urination. She denies headaches, chest pain, shortness of breath, palpitations, numbness, or tingling. Exercising regularly? - No Watching carbohydrate intake? - No Neuropathy ? - No Hypoglycemic events - unsure, not checking blood sugar at home  Polyuria - Yes Polydipsia - Yes Vision problems - No 24 hour diet recall: water, soda, vegetable soup, [place] sandwich, turnip greens, and stir fry.  Relevant past medical, surgical, family, and social history reviewed and updated as indicated.  Allergies and medications reviewed and updated.   Past Medical History:  Diagnosis Date  . Abnormal Pap smear 1999  . Anemia   . Asthma   . BV (bacterial vaginosis) 05/07/2013  . Complication of anesthesia    Takes a while to wake up  . Hyperlipidemia   . Seizures ([place]) 1990  . Vaginal discharge 05/07/2013  . Vaginal Pap smear, abnormal     Past Surgical History:  Procedure Laterality Date  . BRAIN SURGERY  1990  . CESAREAN SECTION  2004  . COLONOSCOPY N/A 03/13/2013   Procedure: COLONOSCOPY;  Surgeon: Rogene Houston, MD;  Location: [facility];  Service: Endoscopy;  Laterality: N/A;  830  . GYNECOLOGIC  CRYOSURGERY  1999   Abnormal Pap  . TUBAL LIGATION      Social History   Socioeconomic History  . Marital status: Married    Spouse name: Not on file  . Number of children: 2  . Years of education: college4  . Highest education level: Not on file  Occupational History  . 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[employer]  . Financial resource strain: Not on file  . Food insecurity:    Worry: Not on file    Inability: Not on file  . Transportation needs:    Medical: Not on file    Non-medical: Not on file  Tobacco Use  . Smoking status: Never Smoker  . Smokeless tobacco: Never Used  Substance and Sexual Activity  . Alcohol use: No  . Drug use: No  . Sexual activity: Yes    Partners: Male    Birth control/protection: Surgical, Post-menopausal    Comment: tubal  Lifestyle  . Physical activity:    Days per week: Not on file    Minutes per session: Not on file  . Stress: Not on file  Relationships  . Social connections:    Talks on phone: Not on file    Gets together: Not on file    Attends religious service: Not on file    Active member of club or organization: Not on file    Attends meetings of clubs or organizations: Not  on file    Relationship status: Not on file  . Intimate partner violence:    Fear of current or ex partner: Not on file    Emotionally abused: Not on file    Physically abused: Not on file    Forced sexual activity: Not on file  Other Topics Concern  . Not on file  Social History Narrative  . Not on file    Outpatient Encounter Medications as of 11/09/2017  Medication Sig  . augmented betamethasone dipropionate (DIPROLENE-AF) 0.05 % cream APPLY SPARINGLY TWICE DAILY  . carbamazepine (TEGRETOL) 200 MG tablet Take 1 tablet (200 mg total) by mouth 2 (two) times daily.  . cetirizine (ZYRTEC) 10 MG tablet Take 10 mg by mouth daily.  . cholecalciferol (VITAMIN D) 1000 units tablet Take 1,000  Units by mouth daily.  . ferrous sulfate 325 (65 FE) MG tablet Take 325 mg by mouth daily with breakfast.  . fish oil-omega-3 fatty acids 1000 MG capsule Take 2 g by mouth daily.  . blood glucose meter kit and supplies Dispense based on patient and insurance preference. Use up to four times daily as directed. (FOR ICD-10 E10.9, E11.9).  . metFORMIN (GLUCOPHAGE XR) 500 MG 24 hr tablet Take 1 tablet (500 mg total) by mouth daily with breakfast.   No facility-administered encounter medications on file as of 11/09/2017.     Allergies  Allergen Reactions  . Dilantin [Phenytoin] Swelling    Swelling of lymph nodes    Review of Systems  Constitutional: Positive for appetite change and unexpected weight change. Negative for activity change, chills, fatigue and fever.  Eyes: Negative for photophobia and visual disturbance.  Respiratory: Negative for cough, chest tightness and shortness of breath.   Cardiovascular: Positive for leg swelling (intermittent ankle swelling). Negative for chest pain and palpitations.  Gastrointestinal: Negative for abdominal pain, constipation, diarrhea, nausea and vomiting.  Endocrine: Positive for polydipsia, polyphagia and polyuria.  Genitourinary: Negative for dysuria and urgency.  Musculoskeletal: Negative for arthralgias and myalgias.  Neurological: Negative for dizziness, seizures, weakness, light-headedness, numbness and headaches.  Psychiatric/Behavioral: Negative for confusion.  All other systems reviewed and are negative.       Objective:    BP 117/75   Pulse 86   Temp 98.4 F (36.9 C) (Oral)   Ht '5\' 2"'  (1.575 m)   Wt 192 lb (87.1 kg)   LMP 12/29/2013   BMI 35.12 kg/m    Wt Readings from Last 3 Encounters:  11/09/17 192 lb (87.1 kg)  10/31/17 192 lb 6.4 oz (87.3 kg)  05/19/17 164 lb (74.4 kg)    Physical Exam  Constitutional: She is oriented to person, place, and time. She appears well-developed and well-nourished. She is cooperative. No  distress.  HENT:  Head: Normocephalic and atraumatic.  Eyes: Pupils are equal, round, and reactive to light. Conjunctivae, EOM and lids are normal.  Neck: Trachea normal, normal range of motion and phonation normal. Neck supple. Carotid bruit is not present. No thyromegaly present.  Cardiovascular: Normal rate, regular rhythm and intact distal pulses. Exam reveals no gallop and no friction rub.  No murmur heard. Pulses:      Dorsalis pedis pulses are 2+ on the right side, and 2+ on the left side.       Posterior tibial pulses are 2+ on the right side, and 2+ on the left side.  Pulmonary/Chest: Effort normal and breath sounds normal. No respiratory distress.  Abdominal: Soft. Normal appearance and bowel sounds are normal.  Feet:  Right Foot:  Skin Integrity: Negative for ulcer, blister, skin breakdown, erythema, warmth, callus or dry skin.  Left Foot:  Skin Integrity: Negative for ulcer, blister, erythema, warmth, callus or dry skin.  Lymphadenopathy:    She has no cervical adenopathy.  Neurological: She is alert and oriented to person, place, and time. She has normal strength and normal reflexes. No cranial nerve deficit or sensory deficit.  Skin: Skin is warm and dry. Capillary refill takes less than 2 seconds. No rash noted. No erythema.  Psychiatric: She has a normal mood and affect. Her speech is normal and behavior is normal. Judgment and thought content normal. Cognition and memory are normal.  Nursing note and vitals reviewed.   Results for orders placed or performed in visit on 05/19/17  Bayer DCA Hb A1c Waived  Result Value Ref Range   HB A1C (BAYER DCA - WAIVED) 6.2 <7.0 %  Carbamazepine level, total  Result Value Ref Range   Carbamazepine (Tegretol), S 6.6 4.0 - 12.0 ug/mL  TSH  Result Value Ref Range   TSH 1.970 0.450 - 4.500 uIU/mL       Pertinent labs & imaging results that were available during my care of the patient were reviewed by me and considered in my  medical decision making.  Assessment & Plan:  Suzanne Jimenez was seen today for swelling in hands, feet and ankles and a1c check.  Diagnoses and all orders for this visit:  New onset type 2 diabetes mellitus ([place]) A1C 6.2 on 05/19/17 and 6.6 today. Pt has made lifestyle changes without success. Pt reluctant to start metformin, but is willing to start. Pt to check blood sugars at home. Continue diet and exercise. Limit sugary snacks and drinks. Increase water intake.  -     metFORMIN (GLUCOPHAGE XR) 500 MG 24 hr tablet; Take 1 tablet (500 mg total) by mouth daily with breakfast. -     CMP14+EGFR -     CBC with Differential/Platelet -     Lipid panel -     Ambulatory referral to diabetic education -     blood glucose meter kit and supplies; Dispense based on patient and insurance preference. Use up to four times daily as directed. (FOR ICD-10 E10.9, E11.9). -     Microalbumin / creatinine urine ratio  Screening for diabetes mellitus (DM) -     Bayer DCA Hb A1c Waived  BMI 35.0-35.9,adult Lifestyle changes. Diet and exercise encouraged.  -     CMP14+EGFR -     CBC with Differential/Platelet -     Lipid panel   Continue all other maintenance medications.  Follow up plan: Return in about 3 months (around 02/09/2018), or if symptoms worsen or fail to improve.  Educational handout given for diabetes mellitus and exercise, carbohydrate counting for diabetes mellitus  The above assessment and management plan was discussed with the patient. The patient verbalized understanding of and has agreed to the management plan. Patient is aware to call the clinic if symptoms persist or worsen. Patient is aware when to return to the clinic for a follow-up visit. Patient educated on when it is appropriate to go to the emergency department.   Monia Pouch, FNP-C [facility] [phone]

## 2017-11-09 NOTE — Patient Instructions (Signed)
Carbohydrate Counting for Diabetes Mellitus, Adult Carbohydrate counting is a method for keeping track of how many carbohydrates you eat. Eating carbohydrates naturally increases the amount of sugar (glucose) in the blood. Counting how many carbohydrates you eat helps keep your blood glucose within normal limits, which helps you manage your diabetes (diabetes mellitus). It is important to know how many carbohydrates you can safely have in each meal. This is different for every person. A diet and nutrition specialist (registered dietitian) can help you make a meal plan and calculate how many carbohydrates you should have at each meal and snack. Carbohydrates are found in the following foods:  Grains, such as breads and cereals.  Dried beans and soy products.  Starchy vegetables, such as potatoes, peas, and corn.  Fruit and fruit juices.  Milk and yogurt.  Sweets and snack foods, such as cake, cookies, candy, chips, and soft drinks.  How do I count carbohydrates? There are two ways to count carbohydrates in food. You can use either of the methods or a combination of both. Reading "Nutrition Facts" on packaged food The "Nutrition Facts" list is included on the labels of almost all packaged foods and beverages in the [place] It includes:  The serving size.  Information about nutrients in each serving, including the grams (g) of carbohydrate per serving.  To use the "Nutrition Facts":  Decide how many servings you will have.  Multiply the number of servings by the number of carbohydrates per serving.  The resulting number is the total amount of carbohydrates that you will be having.  Learning standard serving sizes of other foods When you eat foods containing carbohydrates that are not packaged or do not include "Nutrition Facts" on the label, you need to measure the servings in order to count the amount of carbohydrates:  Measure the foods that you will eat with a food scale or  measuring cup, if needed.  Decide how many standard-size servings you will eat.  Multiply the number of servings by 15. Most carbohydrate-rich foods have about 15 g of carbohydrates per serving. ? For example, if you eat 8 oz (170 g) of strawberries, you will have eaten 2 servings and 30 g of carbohydrates (2 servings x 15 g = 30 g).  For foods that have more than one food mixed, such as soups and casseroles, you must count the carbohydrates in each food that is included.  The following list contains standard serving sizes of common carbohydrate-rich foods. Each of these servings has about 15 g of carbohydrates:   hamburger bun or  English muffin.   oz (15 mL) syrup.   oz (14 g) jelly.  1 slice of bread.  1 six-inch tortilla.  3 oz (85 g) cooked rice or pasta.  4 oz (113 g) cooked dried beans.  4 oz (113 g) starchy vegetable, such as peas, corn, or potatoes.  4 oz (113 g) hot cereal.  4 oz (113 g) mashed potatoes or  of a large baked potato.  4 oz (113 g) canned or frozen fruit.  4 oz (120 mL) fruit juice.  4-6 crackers.  6 chicken nuggets.  6 oz (170 g) unsweetened dry cereal.  6 oz (170 g) plain fat-free yogurt or yogurt sweetened with artificial sweeteners.  8 oz (240 mL) milk.  8 oz (170 g) fresh fruit or one small piece of fruit.  24 oz (680 g) popped popcorn.  Example of carbohydrate counting Sample meal  3 oz (85 g) chicken breast.    6 oz (170 g) brown rice.  4 oz (113 g) corn.  8 oz (240 mL) milk.  8 oz (170 g) strawberries with sugar-free whipped topping. Carbohydrate calculation 1. Identify the foods that contain carbohydrates: ? Rice. ? Corn. ? Milk. ? Strawberries. 2. Calculate how many servings you have of each food: ? 2 servings rice. ? 1 serving corn. ? 1 serving milk. ? 1 serving strawberries. 3. Multiply each number of servings by 15 g: ? 2 servings rice x 15 g = 30 g. ? 1 serving corn x 15 g = 15 g. ? 1 serving milk x 15  g = 15 g. ? 1 serving strawberries x 15 g = 15 g. 4. Add together all of the amounts to find the total grams of carbohydrates eaten: ? 30 g + 15 g + 15 g + 15 g = 75 g of carbohydrates total. This information is not intended to replace advice given to you by your health care provider. Make sure you discuss any questions you have with your health care provider. Document Released: 12/27/2004 Document Revised: 07/17/2015 Document Reviewed: 06/10/2015 Elsevier Interactive Patient Education  2018 [employer]. Diabetes Mellitus and Exercise Exercising regularly is important for your overall health, especially when you have diabetes (diabetes mellitus). Exercising is not only about losing weight. It has many health benefits, such as increasing muscle strength and bone density and reducing body fat and stress. This leads to improved fitness, flexibility, and endurance, all of which result in better overall health. Exercise has additional benefits for people with diabetes, including:  Reducing appetite.  Helping to lower and control blood glucose.  Lowering blood pressure.  Helping to control amounts of fatty substances (lipids) in the blood, such as cholesterol and triglycerides.  Helping the body to respond better to insulin (improving insulin sensitivity).  Reducing how much insulin the body needs.  Decreasing the risk for heart disease by: ? Lowering cholesterol and triglyceride levels. ? Increasing the levels of good cholesterol. ? Lowering blood glucose levels.  What is my activity plan? Your health care provider or certified diabetes educator can help you make a plan for the type and frequency of exercise (activity plan) that works for you. Make sure that you:  Do at least 150 minutes of moderate-intensity or vigorous-intensity exercise each week. This could be brisk walking, biking, or water aerobics. ? Do stretching and strength exercises, such as yoga or weightlifting, at least 2  times a week. ? Spread out your activity over at least 3 days of the week.  Get some form of physical activity every day. ? Do not go more than 2 days in a row without some kind of physical activity. ? Avoid being inactive for more than 90 minutes at a time. Take frequent breaks to walk or stretch.  Choose a type of exercise or activity that you enjoy, and set realistic goals.  Start slowly, and gradually increase the intensity of your exercise over time.  What do I need to know about managing my diabetes?  Check your blood glucose before and after exercising. ? If your blood glucose is higher than 240 mg/dL (13.3 mmol/L) before you exercise, check your urine for ketones. If you have ketones in your urine, do not exercise until your blood glucose returns to normal.  Know the symptoms of low blood glucose (hypoglycemia) and how to treat it. Your risk for hypoglycemia increases during and after exercise. Common symptoms of hypoglycemia can include: ? Hunger. ?  Hunger. ? Anxiety. ? Sweating and feeling clammy. ? Confusion. ? Dizziness or feeling light-headed. ? Increased heart rate or palpitations. ? Blurry vision. ? Tingling or numbness around the mouth, lips, or tongue. ? Tremors or shakes. ? Irritability.  Keep a rapid-acting carbohydrate snack available before, during, and after exercise to help prevent or treat hypoglycemia.  Avoid injecting insulin into areas of the body that are going to be exercised. For example, avoid injecting insulin into: ? The arms, when playing tennis. ? The legs, when jogging.  Keep records of your exercise habits. Doing this can help you and your health care provider adjust your diabetes management plan as needed. Write down: ? Food that you eat before and after you exercise. ? Blood glucose levels before and after you exercise. ? The type and amount of exercise you have done. ? When your insulin is expected to peak, if you use insulin. Avoid exercising at  times when your insulin is peaking.  When you start a new exercise or activity, work with your health care provider to make sure the activity is safe for you, and to adjust your insulin, medicines, or food intake as needed.  Drink plenty of water while you exercise to prevent dehydration or heat stroke. Drink enough fluid to keep your urine clear or pale yellow. This information is not intended to replace advice given to you by your health care provider. Make sure you discuss any questions you have with your health care provider. Document Released: 03/19/2003 Document Revised: 07/17/2015 Document Reviewed: 06/08/2015 Elsevier Interactive Patient Education  2018 [employer]  

## 2017-11-10 ENCOUNTER — Telehealth: Payer: Self-pay

## 2017-11-10 ENCOUNTER — Telehealth: Payer: Self-pay | Admitting: Family Medicine

## 2017-11-10 LAB — CMP14+EGFR
A/G RATIO: 1.8 (ref 1.2–2.2)
ALBUMIN: 4.4 g/dL (ref 3.5–5.5)
ALK PHOS: 112 IU/L (ref 39–117)
ALT: 19 IU/L (ref 0–32)
AST: 15 IU/L (ref 0–40)
BUN/Creatinine Ratio: 19 (ref 9–23)
BUN: 16 mg/dL (ref 6–24)
Bilirubin Total: <0.2 mg/dL (ref 0.0–1.2)
CALCIUM: 9.8 mg/dL (ref 8.7–10.2)
CO2: 26 mmol/L (ref 20–29)
CREATININE: 0.83 mg/dL (ref 0.57–1.00)
Chloride: 103 mmol/L (ref 96–106)
GFR calc Af Amer: 92 mL/min/{1.73_m2} (ref >59)
GFR, EST NON AFRICAN AMERICAN: 80 mL/min/{1.73_m2} (ref >59)
Globulin, Total: 2.5 g/dL (ref 1.5–4.5)
Glucose: 97 mg/dL (ref 65–99)
POTASSIUM: 3.8 mmol/L (ref 3.5–5.2)
Sodium: 141 mmol/L (ref 134–144)
TOTAL PROTEIN: 6.9 g/dL (ref 6.0–8.5)

## 2017-11-10 LAB — CBC WITH DIFFERENTIAL/PLATELET
BASOS: 1 %
Basophils Absolute: 0 10*3/uL (ref 0.0–0.2)
EOS (ABSOLUTE): 0.2 10*3/uL (ref 0.0–0.4)
EOS: 3 %
Hematocrit: 36.9 % (ref 34.0–46.6)
Hemoglobin: 12 g/dL (ref 11.1–15.9)
IMMATURE GRANULOCYTES: 1 %
Immature Grans (Abs): 0 10*3/uL (ref 0.0–0.1)
LYMPHS: 41 %
Lymphocytes Absolute: 2.9 10*3/uL (ref 0.7–3.1)
MCH: 27.2 pg (ref 26.6–33.0)
MCHC: 32.5 g/dL (ref 31.5–35.7)
MCV: 84 fL (ref 79–97)
Monocytes Absolute: 0.8 10*3/uL (ref 0.1–0.9)
Monocytes: 11 %
NEUTROS PCT: 43 %
Neutrophils Absolute: 3.1 10*3/uL (ref 1.4–7.0)
PLATELETS: 298 10*3/uL (ref 150–450)
RBC: 4.41 x10E6/uL (ref 3.77–5.28)
RDW: 13.3 % (ref 12.3–15.4)
WBC: 7 10*3/uL (ref 3.4–10.8)

## 2017-11-10 LAB — LIPID PANEL
Chol/HDL Ratio: 4.5 ratio — ABNORMAL HIGH (ref 0.0–4.4)
Cholesterol, Total: 206 mg/dL — ABNORMAL HIGH (ref 100–199)
HDL: 46 mg/dL (ref >39)
LDL Calculated: 128 mg/dL — ABNORMAL HIGH (ref 0–99)
TRIGLYCERIDES: 161 mg/dL — AB (ref 0–149)
VLDL Cholesterol Cal: 32 mg/dL (ref 5–40)

## 2017-11-10 NOTE — Telephone Encounter (Signed)
PT states that she can get a free meter through her insurance the one touch verio flex. She needs [place] to just send in the lancus and the test strips to [facility]

## 2017-11-10 NOTE — Telephone Encounter (Signed)
You can send in the Lancets and test strips. Thanks.  Marcelino Duster

## 2017-11-10 NOTE — Telephone Encounter (Signed)
She is not on this medication

## 2017-11-10 NOTE — Telephone Encounter (Signed)
Please advise 

## 2017-11-10 NOTE — Telephone Encounter (Signed)
Pharmacy sending over hydrochlorothiazide 12.5 qd  This isnot on med list

## 2017-11-13 ENCOUNTER — Other Ambulatory Visit: Payer: Self-pay | Admitting: *Deleted

## 2017-11-13 ENCOUNTER — Other Ambulatory Visit: Payer: Self-pay | Admitting: Family Medicine

## 2017-11-13 DIAGNOSIS — E785 Hyperlipidemia, unspecified: Secondary | ICD-10-CM | POA: Insufficient documentation

## 2017-11-13 DIAGNOSIS — E1169 Type 2 diabetes mellitus with other specified complication: Secondary | ICD-10-CM

## 2017-11-13 DIAGNOSIS — E782 Mixed hyperlipidemia: Secondary | ICD-10-CM

## 2017-11-13 DIAGNOSIS — E119 Type 2 diabetes mellitus without complications: Secondary | ICD-10-CM

## 2017-11-13 HISTORY — DX: Type 2 diabetes mellitus with other specified complication: E11.69

## 2017-11-13 MED ORDER — RED YEAST RICE 600 MG PO CAPS: 2400.0000 mg | ORAL_CAPSULE | Freq: Every day | ORAL | 0 refills | Status: AC

## 2017-11-13 MED ORDER — GLUCOSE BLOOD VI STRP: ORAL_STRIP | 12 refills | Status: DC

## 2017-11-13 MED ORDER — LANCET DEVICE MISC: 12 refills | Status: AC

## 2017-11-13 NOTE — Telephone Encounter (Signed)
Strips and lancets sent to pharmacy 

## 2017-11-14 NOTE — Telephone Encounter (Signed)
Patient aware of lab results.

## 2017-12-19 IMAGING — CR DG CHEST 2V
2 series · 2 of 2 positions shown · non-contrast
Comparison: None.

CLINICAL DATA: Cough.

EXAM:
CHEST  2 VIEW

[view not recorded (1 of 2)]
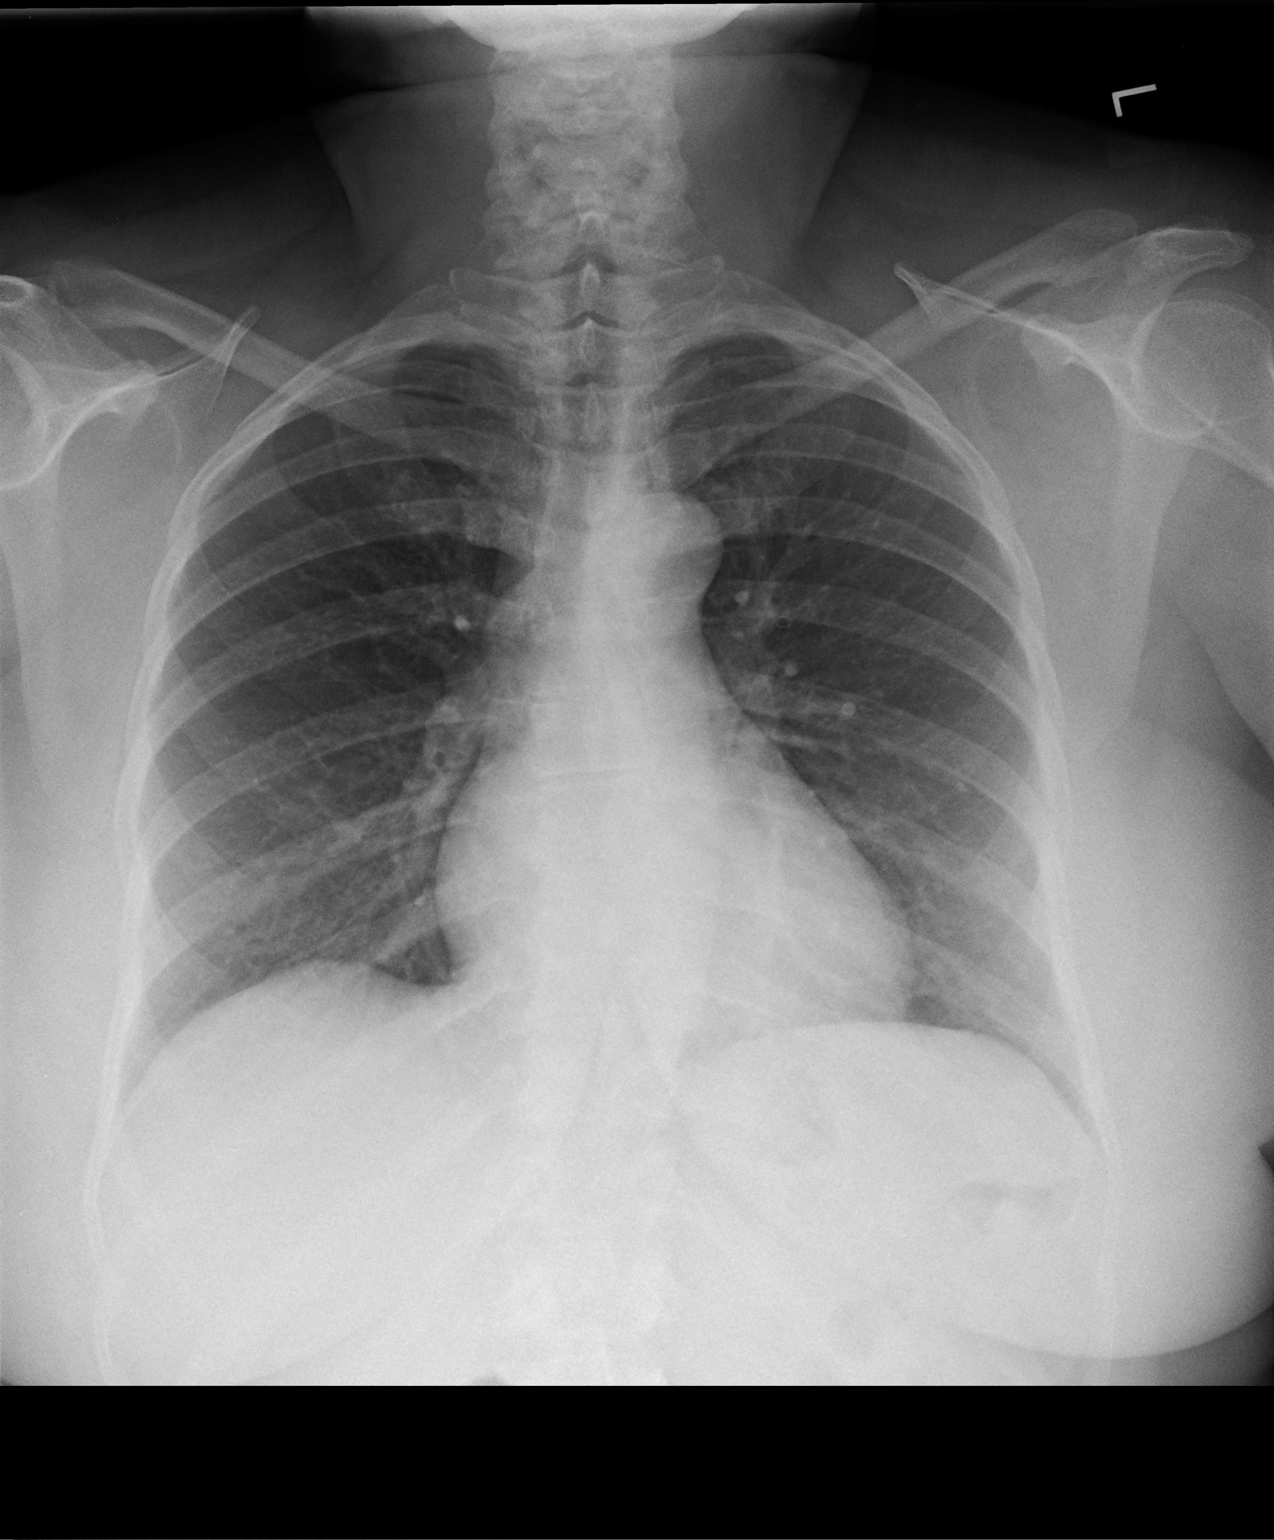

[view not recorded (2 of 2)]
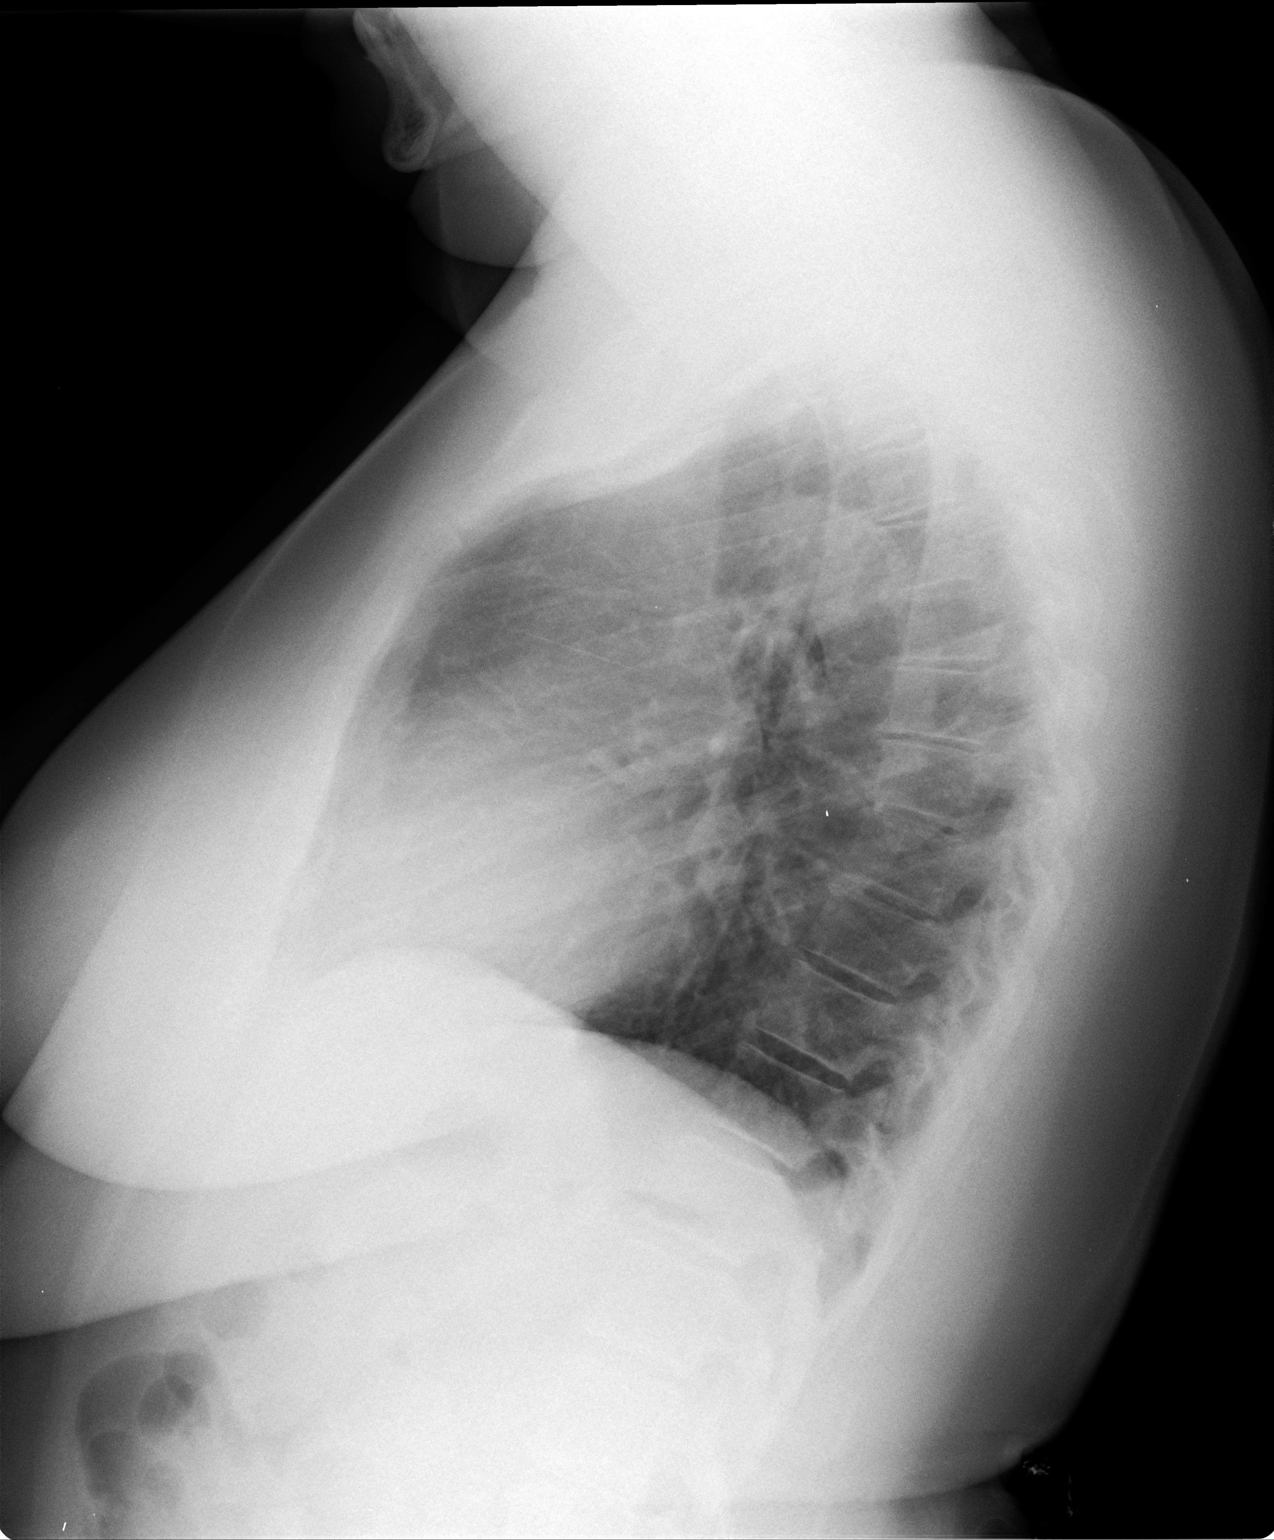

[2 of 2 positions shown; findings below may reference images not displayed]

FINDINGS: Normal heart size and mediastinal contours. No acute infiltrate or
edema. No effusion or pneumothorax. No acute osseous findings.
IMPRESSION: No active cardiopulmonary disease.

## 2018-02-01 ENCOUNTER — Other Ambulatory Visit: Payer: Self-pay | Admitting: Family Medicine

## 2018-02-01 DIAGNOSIS — E119 Type 2 diabetes mellitus without complications: Secondary | ICD-10-CM

## 2018-02-09 ENCOUNTER — Ambulatory Visit (INDEPENDENT_AMBULATORY_CARE_PROVIDER_SITE_OTHER): Payer: Commercial Managed Care - PPO | Admitting: Family Medicine

## 2018-02-09 ENCOUNTER — Encounter: Payer: Self-pay | Admitting: Family Medicine

## 2018-02-09 VITALS — BP 120/77 | HR 79 | Temp 97.6°F | Ht 62.0 in | Wt 183.0 lb

## 2018-02-09 DIAGNOSIS — E1169 Type 2 diabetes mellitus with other specified complication: Secondary | ICD-10-CM

## 2018-02-09 DIAGNOSIS — R21 Rash and other nonspecific skin eruption: Secondary | ICD-10-CM | POA: Diagnosis not present

## 2018-02-09 DIAGNOSIS — Z23 Encounter for immunization: Secondary | ICD-10-CM | POA: Diagnosis not present

## 2018-02-09 DIAGNOSIS — E782 Mixed hyperlipidemia: Secondary | ICD-10-CM | POA: Diagnosis not present

## 2018-02-09 LAB — BAYER DCA HB A1C WAIVED: HB A1C: 6.2 % (ref <7.0)

## 2018-02-09 MED ORDER — METFORMIN HCL ER 500 MG PO TB24: ORAL_TABLET | ORAL | 2 refills | Status: DC

## 2018-02-09 MED ORDER — CLOTRIMAZOLE-BETAMETHASONE 1-0.05 % EX CREA: 1.0000 "application " | TOPICAL_CREAM | Freq: Two times a day (BID) | CUTANEOUS | 0 refills | Status: DC

## 2018-02-09 NOTE — Addendum Note (Signed)
Addended byHilton Cork C on: 02/09/2018 02:00 PM   Modules accepted: Orders

## 2018-02-09 NOTE — Patient Instructions (Signed)
Apply the cream only to the affected area twice daily x7-10 days.  If no resolution, call me and we can refer to dermatology.  Make sure to apply a good moisturizer after the prescription cream.

## 2018-02-09 NOTE — Progress Notes (Signed)
Subjective: CC: DM2 PCP: Janora Norlander, DO WUJ:[id number] Z Suzanne Jimenez is a 58 y.o. female presenting to clinic today for:  1. Type 2 Diabetes w/ HLD:  Patient reports that she is tolerating metformin without difficulty. Side effects: Initially some GI disturbance but this is resolved.  She has met with the dietitian and found the visit helpful.  She has been modifying diet and becoming more active.  She was noted to have hyperlipidemia at last visit as well but is not currently on a statin.  Red rice yeast and OTC fish oils were recommended.  ASCVD risk score was 7.5%.   Last eye exam: Sees Dr. Marin Comment.  Last office visit was in October and she notes that she had a normal exam. Last foot exam: needs Last A1c:  Lab Results  Component Value Date   HGBA1C 6.6 11/09/2017   Nephropathy screen indicated?: performed 11/09/2017 Last flu, zoster and/or pneumovax: Needs PNA vaccine Immunization History  Administered Date(s) Administered  . Influenza,inj,Quad PF,6+ Mos 10/17/2012, 10/09/2013, 10/14/2014, 10/24/2016  . Influenza,inj,quad, With Preservative 11/02/2017  . Tdap 07/29/2009    ROS: Denies dizziness, LOC, polyuria, polydipsia (though she reports she drinks a lot of water at baseline), unintended weight loss/gain, foot ulcerations, numbness or tingling in extremities or chest pain.  2. Rash Patient notes a rash on the left posterior thigh that is itchy and has been present about a month.  Denies any change in size but it seems to be refractory to over-the-counter corticosteroid cream.  No rash elsewhere at this time but she thinks perhaps she had a similar spot on the right posterior thigh.    ROS: Per HPI  Allergies  Allergen Reactions  . Dilantin [Phenytoin] Swelling    Swelling of lymph nodes  . Phenytoin Sodium Extended Rash   Past Medical History:  Diagnosis Date  . Abnormal Pap smear 1999  . Anemia   . Asthma   . BV (bacterial vaginosis) 05/07/2013  . Complication  of anesthesia    Takes a while to wake up  . Hyperlipidemia   . Seizures ([place]) 1990  . Vaginal discharge 05/07/2013  . Vaginal Pap smear, abnormal     Current Outpatient Medications:  .  augmented betamethasone dipropionate (DIPROLENE-AF) 0.05 % cream, APPLY SPARINGLY TWICE DAILY, Disp: , Rfl: 0 .  blood glucose meter kit and supplies, Dispense based on patient and insurance preference. Use up to four times daily as directed. (FOR ICD-10 E10.9, E11.9)., Disp: 1 each, Rfl: 0 .  carbamazepine (TEGRETOL) 200 MG tablet, Take 1 tablet (200 mg total) by mouth 2 (two) times daily., Disp: 180 tablet, Rfl: 3 .  cetirizine (ZYRTEC) 10 MG tablet, Take 10 mg by mouth daily., Disp: , Rfl:  .  cholecalciferol (VITAMIN D) 1000 units tablet, Take 1,000 Units by mouth daily., Disp: , Rfl:  .  ferrous sulfate 325 (65 FE) MG tablet, Take 325 mg by mouth daily with breakfast., Disp: , Rfl:  .  fish oil-omega-3 fatty acids 1000 MG capsule, Take 2 g by mouth daily., Disp: , Rfl:  .  glucose blood test strip, Test twice daily,, Disp: 100 each, Rfl: 12 .  Lancet Device MISC, Test twice daily, Disp: 100 each, Rfl: 12 .  metFORMIN (GLUCOPHAGE-XR) 500 MG 24 hr tablet, TAKE 1 TABLET BY MOUTH ONCE DAILY WITH BREAKFAST, Disp: 30 tablet, Rfl: 0 Social History   Socioeconomic History  . Marital status: Married    Spouse name: Not on file  . Number  of children: 2  . Years of education: college4  . Highest education level: Not on file  Occupational History  . 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[employer]  . Financial resource strain: Not on file  . Food insecurity:    Worry: Not on file    Inability: Not on file  . Transportation needs:    Medical: Not on file    Non-medical: Not on file  Tobacco Use  . Smoking status: Never Smoker  . Smokeless tobacco: Never Used  Substance and Sexual Activity  . Alcohol use: No  . Drug use: No  . Sexual activity: Yes     Partners: Male    Birth control/protection: Surgical, Post-menopausal    Comment: tubal  Lifestyle  . Physical activity:    Days per week: Not on file    Minutes per session: Not on file  . Stress: Not on file  Relationships  . Social connections:    Talks on phone: Not on file    Gets together: Not on file    Attends religious service: Not on file    Active member of club or organization: Not on file    Attends meetings of clubs or organizations: Not on file    Relationship status: Not on file  . Intimate partner violence:    Fear of current or ex partner: Not on file    Emotionally abused: Not on file    Physically abused: Not on file    Forced sexual activity: Not on file  Other Topics Concern  . Not on file  Social History Narrative  . Not on file   Family History  Problem Relation Age of Onset  . Diabetes Mother   . Hypertension Mother   . Diabetes Father   . Heart disease Father   . Hypertension Father   . Heart disease Brother   . Diabetes Maternal Aunt   . Hypertension Maternal Aunt   . Diabetes Maternal Uncle   . Hypertension Maternal Uncle   . Colon cancer Neg Hx     Objective: Office vital signs reviewed. BP 120/77   Pulse 79   Temp 97.6 F (36.4 C) (Oral)   Ht '5\' 2"'  (1.575 m)   Wt 183 lb (83 kg)   LMP 12/29/2013   BMI 33.47 kg/m   Physical Examination:  General: Awake, alert, well nourished, No acute distress HEENT: Normal, sclera white, MMM Cardio: regular rate and rhythm, S1S2 heard, no murmurs appreciated Pulm: clear to auscultation bilaterally, no wheezes, rhonchi or rales; normal work of breathing on room air Extremities: warm, well perfused, No edema, cyanosis or clubbing; +2 pulses bilaterally Skin: rough skin colored, scaly patch about quarter size on the left posterior thigh.  There is no appreciable induration, exudate or fluctuance. Neuro: see DM foot exam  Diabetic Foot Exam - Simple   Simple Foot Form Diabetic Foot exam was  performed with the following findings:  Yes 02/09/2018 12:43 PM  Visual Inspection No deformities, no ulcerations, no other skin breakdown bilaterally:  Yes Sensation Testing Intact to touch and monofilament testing bilaterally:  Yes Pulse Check Posterior Tibialis and Dorsalis pulse intact bilaterally:  Yes Comments     The 10-year ASCVD risk score Mikey Bussing DC Jr., et al., 2013) is: 7.5%   Values used to calculate the score:     Age: 52 years     Sex: Female     Is  Non-Hispanic African American: Yes     Diabetic: Yes     Tobacco smoker: No     Systolic Blood Pressure: 297 mmHg     Is BP treated: No     HDL Cholesterol: 46 mg/dL     Total Cholesterol: 206 mg/dL  Assessment/ Plan: 58 y.o. female   1. DM type 2 with diabetic mixed hyperlipidemia (HCC) A1c under excellent control at 6.2 with once daily metformin.  Continue once daily dosing.  Refills have been sent.  May follow-up in 6 months, sooner if needed.  Continue lifestyle modification.  We will obtain her eye exam from October.  Foot exam updated.  She was also given her pneumococcal vaccination.  We will recheck labs today.  May need to consider statin given recent ASCVD risk of 7.5%. - Lipid Panel - Bayer DCA Hb A1c Waived - CMP14+EGFR - metFORMIN (GLUCOPHAGE-XR) 500 MG 24 hr tablet; TAKE 1 TABLET BY MOUTH ONCE DAILY WITH BREAKFAST  Dispense: 90 tablet; Refill: 2  2. Rash and nonspecific skin eruption Uncertain etiology.  Trial of clotrimazole with betamethasone for the next 7 to 10 days. Advised of skin care regimen w/ emollient.  If no substantial improvement, plan for referral to dermatology.   Orders Placed This Encounter  Procedures  . Lipid Panel  . Bayer DCA Hb A1c Waived  . CMP14+EGFR   Meds ordered this encounter  Medications  . clotrimazole-betamethasone (LOTRISONE) cream    Sig: Apply 1 application topically 2 (two) times daily. x7-10 days    Dispense:  30 g    Refill:  0  . metFORMIN (GLUCOPHAGE-XR) 500  MG 24 hr tablet    Sig: TAKE 1 TABLET BY MOUTH ONCE DAILY WITH BREAKFAST    Dispense:  90 tablet    Refill:  2     Island Dohmen Windell Moulding, DO [place] [phone]

## 2018-02-10 LAB — LIPID PANEL
CHOLESTEROL TOTAL: 185 mg/dL (ref 100–199)
Chol/HDL Ratio: 4.5 ratio — ABNORMAL HIGH (ref 0.0–4.4)
HDL: 41 mg/dL (ref >39)
LDL Calculated: 116 mg/dL — ABNORMAL HIGH (ref 0–99)
TRIGLYCERIDES: 141 mg/dL (ref 0–149)
VLDL Cholesterol Cal: 28 mg/dL (ref 5–40)

## 2018-02-10 LAB — CMP14+EGFR
ALBUMIN: 4.6 g/dL (ref 3.8–4.9)
ALK PHOS: 107 IU/L (ref 39–117)
ALT: 22 IU/L (ref 0–32)
AST: 17 IU/L (ref 0–40)
Albumin/Globulin Ratio: 2.2 (ref 1.2–2.2)
BUN / CREAT RATIO: 16 (ref 9–23)
BUN: 12 mg/dL (ref 6–24)
Bilirubin Total: <0.2 mg/dL (ref 0.0–1.2)
CO2: 23 mmol/L (ref 20–29)
Calcium: 9.8 mg/dL (ref 8.7–10.2)
Chloride: 107 mmol/L — ABNORMAL HIGH (ref 96–106)
Creatinine, Ser: 0.76 mg/dL (ref 0.57–1.00)
GFR calc Af Amer: 101 mL/min/{1.73_m2} (ref >59)
GFR calc non Af Amer: 88 mL/min/{1.73_m2} (ref >59)
Globulin, Total: 2.1 g/dL (ref 1.5–4.5)
Glucose: 131 mg/dL — ABNORMAL HIGH (ref 65–99)
Potassium: 4.1 mmol/L (ref 3.5–5.2)
Sodium: 145 mmol/L — ABNORMAL HIGH (ref 134–144)
Total Protein: 6.7 g/dL (ref 6.0–8.5)

## 2018-02-25 ENCOUNTER — Other Ambulatory Visit: Payer: Self-pay

## 2018-02-25 ENCOUNTER — Emergency Department (HOSPITAL_COMMUNITY)
Admission: EM | Admit: 2018-02-25 | Discharge: 2018-02-25 | Disposition: A | Discharge status: Home / Self Care | Payer: Commercial Managed Care - PPO | Attending: Emergency Medicine | Admitting: Emergency Medicine

## 2018-02-25 ENCOUNTER — Encounter (HOSPITAL_COMMUNITY): Payer: Self-pay | Admitting: Emergency Medicine

## 2018-02-25 DIAGNOSIS — E119 Type 2 diabetes mellitus without complications: Secondary | ICD-10-CM | POA: Insufficient documentation

## 2018-02-25 DIAGNOSIS — R197 Diarrhea, unspecified: Secondary | ICD-10-CM | POA: Diagnosis present

## 2018-02-25 DIAGNOSIS — E876 Hypokalemia: Secondary | ICD-10-CM | POA: Insufficient documentation

## 2018-02-25 DIAGNOSIS — Z79899 Other long term (current) drug therapy: Secondary | ICD-10-CM | POA: Insufficient documentation

## 2018-02-25 DIAGNOSIS — R109 Unspecified abdominal pain: Secondary | ICD-10-CM | POA: Diagnosis not present

## 2018-02-25 DIAGNOSIS — Z7984 Long term (current) use of oral hypoglycemic drugs: Secondary | ICD-10-CM | POA: Insufficient documentation

## 2018-02-25 LAB — COMPREHENSIVE METABOLIC PANEL
ALT: 27 U/L (ref 0–44)
AST: 25 U/L (ref 15–41)
Albumin: 4 g/dL (ref 3.5–5.0)
Alkaline Phosphatase: 87 U/L (ref 38–126)
Anion gap: 8 (ref 5–15)
BUN: 11 mg/dL (ref 6–20)
CO2: 23 mmol/L (ref 22–32)
Calcium: 9.3 mg/dL (ref 8.9–10.3)
Chloride: 109 mmol/L (ref 98–111)
Creatinine, Ser: 0.65 mg/dL (ref 0.44–1.00)
GFR calc Af Amer: >60 mL/min (ref >60)
GFR calc non Af Amer: >60 mL/min (ref >60)
Glucose, Bld: 120 mg/dL — ABNORMAL HIGH (ref 70–99)
Potassium: 3.3 mmol/L — ABNORMAL LOW (ref 3.5–5.1)
Sodium: 140 mmol/L (ref 135–145)
Total Bilirubin: 0.2 mg/dL — ABNORMAL LOW (ref 0.3–1.2)
Total Protein: 7.2 g/dL (ref 6.5–8.1)

## 2018-02-25 LAB — CBC
HCT: 39 % (ref 36.0–46.0)
Hemoglobin: 12.1 g/dL (ref 12.0–15.0)
MCH: 27.2 pg (ref 26.0–34.0)
MCHC: 31 g/dL (ref 30.0–36.0)
MCV: 87.6 fL (ref 80.0–100.0)
Platelets: 285 10*3/uL (ref 150–400)
RBC: 4.45 MIL/uL (ref 3.87–5.11)
RDW: 13.9 % (ref 11.5–15.5)
WBC: 10.4 10*3/uL (ref 4.0–10.5)
nRBC: 0 % (ref 0.0–0.2)

## 2018-02-25 LAB — URINALYSIS, ROUTINE W REFLEX MICROSCOPIC
Bacteria, UA: NONE SEEN
Bilirubin Urine: NEGATIVE
Glucose, UA: NEGATIVE mg/dL
Hgb urine dipstick: NEGATIVE
Ketones, ur: NEGATIVE mg/dL
Nitrite: NEGATIVE
PH: 5 (ref 5.0–8.0)
Protein, ur: NEGATIVE mg/dL
Specific Gravity, Urine: 1.032 — ABNORMAL HIGH (ref 1.005–1.030)
WBC, UA: >50 WBC/hpf — ABNORMAL HIGH (ref 0–5)

## 2018-02-25 LAB — CBG MONITORING, ED: Glucose-Capillary: 101 mg/dL — ABNORMAL HIGH (ref 70–99)

## 2018-02-25 LAB — LIPASE, BLOOD: LIPASE: 29 U/L (ref 11–51)

## 2018-02-25 MED ORDER — DICYCLOMINE HCL 20 MG PO TABS: 20.0000 mg | ORAL_TABLET | Freq: Two times a day (BID) | ORAL | 0 refills | Status: DC | PRN

## 2018-02-25 NOTE — ED Provider Notes (Signed)
[facility] EMERGENCY DEPARTMENT Provider Note   CSN: [id number] Arrival date & time: 02/25/18  1742     History   Chief Complaint Chief Complaint  Patient presents with  . Diarrhea    HPI Suzanne Jimenez is a 58 y.o. female.  The history is provided by the patient.  Diarrhea  Quality:  Watery Severity:  Moderate Onset quality:  Gradual Number of episodes:  7 daily for a few days, today only 2 Duration:  5 days Timing:  Constant Progression:  Improving Relieved by:  Nothing Associated symptoms: abdominal pain   Risk factors: no recent antibiotic use, no sick contacts, no suspicious food intake and no travel to endemic areas     Past Medical History:  Diagnosis Date  . Abnormal Pap smear 1999  . Anemia   . Asthma   . BV (bacterial vaginosis) 05/07/2013  . Complication of anesthesia    Takes a while to wake up  . Hyperlipidemia   . Seizures ([place]) 1990  . Vaginal discharge 05/07/2013  . Vaginal Pap smear, abnormal     Patient Active Problem List   Diagnosis Date Noted  . DM type 2 with diabetic mixed hyperlipidemia ([place]) 11/13/2017  . Mixed hyperlipidemia 11/13/2017  . Type 2 diabetes mellitus without complication, without long-term current use of insulin ([place]) 11/09/2017  . BMI 35.0-35.9,adult 11/09/2017  . Swelling 07/06/2015  . Pain in limb 10/14/2014  . Hand pain 10/14/2014  . Fatigue 08/21/2014  . Nevus, non-neoplastic 08/21/2014  . Seborrheic keratosis 08/21/2014  . 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 DUB (dysfunctional uterine bleeding) 07/03/2012    Past Surgical History:  Procedure Laterality Date  . BRAIN SURGERY  1990  . CESAREAN SECTION  2004  . COLONOSCOPY N/A 03/13/2013   Procedure: COLONOSCOPY;  Surgeon: Rogene Houston, MD;  Location: [facility];  Service: Endoscopy;  Laterality: N/A;  830  . GYNECOLOGIC CRYOSURGERY  1999   Abnormal Pap  . TUBAL LIGATION       OB History    Gravida  3   Para  2   Term  2   Preterm  0   AB  1   Living  2     SAB  0   TAB  1   Ectopic  0   Multiple  0   Live Births  2            Home Medications    Prior to Admission medications   Medication Sig Start Date End Date Taking? Authorizing Provider  augmented betamethasone dipropionate (DIPROLENE-AF) 0.05 % cream APPLY SPARINGLY TWICE DAILY 10/20/17   [provider]  blood glucose meter kit and supplies Dispense based on patient and insurance preference. Use up to four times daily as directed. (FOR ICD-10 E10.9, E11.9). 11/09/17   Baruch Gouty, FNP  carbamazepine (TEGRETOL) 200 MG tablet Take 1 tablet (200 mg total) by mouth 2 (two) times daily. 10/31/17   Dennie Bible, NP  cetirizine (ZYRTEC) 10 MG tablet Take 10 mg by mouth daily.    [provider]  cholecalciferol (VITAMIN D) 1000 units tablet Take 1,000 Units by mouth daily.    [provider]  clotrimazole-betamethasone (LOTRISONE) cream Apply 1 application topically 2 (two) times daily. x7-10 days 02/09/18   Janora Norlander, DO  dicyclomine (BENTYL) 20 MG tablet Take 1 tablet (20 mg total) by mouth 2 (two) times daily as needed for spasms (abdominal  cramping). 02/25/18   Avyn Aden, Corene Cornea, MD  ferrous sulfate 325 (65 FE) MG tablet Take 325 mg by mouth daily with breakfast.    [provider]  fish oil-omega-3 fatty acids 1000 MG capsule Take 2 g by mouth daily.    [provider]  glucose blood test strip Test twice daily, 11/13/17   Rakes, Connye Burkitt, FNP  Lancet Device MISC Test twice daily 11/13/17   Baruch Gouty, FNP  metFORMIN (GLUCOPHAGE-XR) 500 MG 24 hr tablet TAKE 1 TABLET BY MOUTH ONCE DAILY WITH BREAKFAST 02/09/18   Janora Norlander, DO    Family History Family History  Problem Relation Age of Onset  . Diabetes Mother   . Hypertension Mother   . Diabetes Father   . Heart disease Father   . Hypertension Father   . Heart disease Brother    . Diabetes Maternal Aunt   . Hypertension Maternal Aunt   . Diabetes Maternal Uncle   . Hypertension Maternal Uncle   . Colon cancer Neg Hx     Social History Social History   Tobacco Use  . Smoking status: Never Smoker  . Smokeless tobacco: Never Used  Substance Use Topics  . Alcohol use: No  . Drug use: No     Allergies   Dilantin [phenytoin] and Phenytoin sodium extended   Review of Systems Review of Systems  Gastrointestinal: Positive for abdominal pain and diarrhea.  All other systems reviewed and are negative.    Physical Exam Updated Vital Signs BP (!) 142/83 (BP Location: Right Arm)   Pulse 84   Temp 98.2 F (36.8 C) (Oral)   Resp 18   Ht '5\' 2"'  (1.575 m)   Wt 83 kg   LMP 12/29/2013   SpO2 99%   BMI 33.47 kg/m   Physical Exam Vitals signs and nursing note reviewed.  Constitutional:      Appearance: She is well-developed.  HENT:     Head: Normocephalic and atraumatic.     Mouth/Throat:     Pharynx: Oropharynx is clear.  Eyes:     Extraocular Movements: Extraocular movements intact.     Conjunctiva/sclera: Conjunctivae normal.  Neck:     Musculoskeletal: Normal range of motion.  Cardiovascular:     Rate and Rhythm: Normal rate and regular rhythm.  Pulmonary:     Effort: No respiratory distress.     Breath sounds: No stridor.  Abdominal:     General: Abdomen is flat. There is no distension.  Musculoskeletal: Normal range of motion.        General: No swelling or tenderness.  Skin:    General: Skin is warm and dry.  Neurological:     Mental Status: She is alert.      ED Treatments / Results  Labs (all labs ordered are listed, but only abnormal results are displayed) Labs Reviewed  COMPREHENSIVE METABOLIC PANEL - Abnormal; Notable for the following components:      Result Value   Potassium 3.3 (*)    Glucose, Bld 120 (*)    Total Bilirubin 0.2 (*)    All other components within normal limits  URINALYSIS, ROUTINE W REFLEX  MICROSCOPIC - Abnormal; Notable for the following components:   APPearance HAZY (*)    Specific Gravity, Urine 1.032 (*)    Leukocytes,Ua MODERATE (*)    WBC, UA >50 (*)    Non Squamous Epithelial 0-5 (*)    All other components within normal limits  CBG MONITORING, ED -  Abnormal; Notable for the following components:   Glucose-Capillary 101 (*)    All other components within normal limits  LIPASE, BLOOD  CBC    EKG None  Radiology No results found.  Procedures Procedures (including critical care time)  Medications Ordered in ED Medications - No data to display   Initial Impression / Assessment and Plan / ED Course  I have reviewed the triage vital signs and the nursing notes.  Pertinent labs & imaging results that were available during my care of the patient were reviewed by me and considered in my medical decision making (see chart for details).     Diarrhea seems to be improving. Abdominal pain is cramping. Will try bentyl. No ever, blood or long-term symptoms to suggest bacterial causes. Plan for pcp follow up.  Final Clinical Impressions(s) / ED Diagnoses   Final diagnoses:  Hypokalemia  Abdominal cramping  Diarrhea, unspecified type    ED Discharge Orders         Ordered    dicyclomine (BENTYL) 20 MG tablet  2 times daily PRN     02/25/18 2225           Oretha Weismann, Corene Cornea, MD 02/25/18 2358

## 2018-02-25 NOTE — ED Triage Notes (Signed)
Pt c/o of lower abdominal cramping, diarrhea, and nausea x 5 days.

## 2018-03-09 ENCOUNTER — Ambulatory Visit (INDEPENDENT_AMBULATORY_CARE_PROVIDER_SITE_OTHER): Payer: Commercial Managed Care - PPO | Admitting: Nurse Practitioner

## 2018-03-09 ENCOUNTER — Encounter: Payer: Self-pay | Admitting: Nurse Practitioner

## 2018-03-09 VITALS — BP 130/77 | HR 91 | Temp 98.8°F | Ht 62.0 in | Wt 183.8 lb

## 2018-03-09 DIAGNOSIS — E876 Hypokalemia: Secondary | ICD-10-CM

## 2018-03-09 DIAGNOSIS — J069 Acute upper respiratory infection, unspecified: Secondary | ICD-10-CM | POA: Diagnosis not present

## 2018-03-09 LAB — BMP8+EGFR
BUN/Creatinine Ratio: 11 (ref 9–23)
BUN: 8 mg/dL (ref 6–24)
CO2: 23 mmol/L (ref 20–29)
CREATININE: 0.71 mg/dL (ref 0.57–1.00)
Calcium: 9.4 mg/dL (ref 8.7–10.2)
Chloride: 104 mmol/L (ref 96–106)
GFR calc non Af Amer: 96 mL/min/{1.73_m2} (ref >59)
GFR, EST AFRICAN AMERICAN: 110 mL/min/{1.73_m2} (ref >59)
Glucose: 123 mg/dL — ABNORMAL HIGH (ref 65–99)
Potassium: 3.9 mmol/L (ref 3.5–5.2)
Sodium: 143 mmol/L (ref 134–144)

## 2018-03-09 MED ORDER — BENZONATATE 100 MG PO CAPS: 100.0000 mg | ORAL_CAPSULE | Freq: Three times a day (TID) | ORAL | 0 refills | Status: DC | PRN

## 2018-03-09 MED ORDER — HYDROCODONE-HOMATROPINE 5-1.5 MG/5ML PO SYRP: 5.0000 mL | ORAL_SOLUTION | Freq: Four times a day (QID) | ORAL | 0 refills | Status: DC | PRN

## 2018-03-09 NOTE — Progress Notes (Signed)
Subjective:    Patient ID: Suzanne Jimenez, female    DOB: [birth year], 58 y.o.   MRN: [id number]   Chief Complaint: Cough   HPI Patient comes in c/o cough that started on Tuesday. Has congestion and slight sore throat at beginning.   * went to hospital for diarrhea 02/25/18 and her potassium was low and need it rechecked.  Review of Systems  Constitutional: Negative for chills and fever.  HENT: Positive for congestion. Negative for ear pain, rhinorrhea, sinus pressure, sinus pain, sore throat and trouble swallowing.   Respiratory: Cough: slightly productive.   Cardiovascular: Negative.   Gastrointestinal: Negative.   Neurological: Negative for headaches.  Psychiatric/Behavioral: Negative.   All other systems reviewed and are negative.      Objective:   Physical Exam Vitals signs and nursing note reviewed.  Constitutional:      General: She is not in acute distress.    Appearance: She is obese.  HENT:     Right Ear: Tympanic membrane, ear canal and external ear normal.     Left Ear: Tympanic membrane, ear canal and external ear normal.     Nose: Congestion present.     Mouth/Throat:     Mouth: Mucous membranes are moist.     Pharynx: No oropharyngeal exudate or posterior oropharyngeal erythema.  Eyes:     Pupils: Pupils are equal, round, and reactive to light.  Neck:     Musculoskeletal: Normal range of motion and neck supple.  Cardiovascular:     Rate and Rhythm: Normal rate and regular rhythm.     Heart sounds: Normal heart sounds.  Pulmonary:     Effort: Pulmonary effort is normal.     Breath sounds: Normal breath sounds.     Comments: Dry cough  Lymphadenopathy:     Cervical: No cervical adenopathy.  Skin:    General: Skin is warm.  Neurological:     General: No focal deficit present.     Mental Status: She is alert and oriented to person, place, and time.  Psychiatric:        Mood and Affect: Mood normal.        Behavior: Behavior normal.    BP  130/77   Pulse 91   Temp 98.8 F (37.1 C) (Oral)   Ht '5\' 2"'  (1.575 m)   Wt 183 lb 12.8 oz (83.4 kg)   LMP 12/29/2013   BMI 33.62 kg/m         Assessment & Plan:  Miguel Rota Rushing in today with chief complaint of Cough   1. Low serum potassium Labs pending - BMP8+EGFR  2. Upper respiratory infection with cough and congestion 1. Take meds as prescribed 2. Use a cool mist humidifier especially during the winter months and when heat has been humid. 3. Use saline nose sprays frequently 4. Saline irrigations of the nose can be very helpful if done frequently.  * 4X daily for 1 week*  * Use of a nettie pot can be helpful with this. Follow directions with this* 5. Drink plenty of fluids 6. Keep thermostat turn down low 7.For any cough or congestion  Tessalon perles and hycodan as rx 8. For fever or aces or pains- take tylenol or ibuprofen appropriate for age and weight.  * for fevers greater than 101 orally you may alternate ibuprofen and tylenol every  3 hours.    - benzonatate (TESSALON PERLES) 100 MG capsule; Take 1 capsule (100 mg total) by mouth 3 (  three) times daily as needed for cough.  Dispense: 20 capsule; Refill: 0 - HYDROcodone-homatropine (HYCODAN) 5-1.5 MG/5ML syrup; Take 5 mLs by mouth every 6 (six) hours as needed for cough.  Dispense: 120 mL; Refill: 0  Mary-Margaret Hassell Done, FNP

## 2018-03-09 NOTE — Patient Instructions (Signed)
1. Take meds as prescribed 2. Use a cool mist humidifier especially during the winter months and when heat has been humid. 3. Use saline nose sprays frequently 4. Saline irrigations of the nose can be very helpful if done frequently.  * 4X daily for 1 week*  * Use of a nettie pot can be helpful with this. Follow directions with this* 5. Drink plenty of fluids 6. Keep thermostat turn down low 7.For any cough or congestion   8. For fever or aces or pains- take tylenol or ibuprofen appropTessalon perles and hycodan as rxriate for age and weight.  * for fevers greater than 101 orally you may alternate ibuprofen and tylenol every  3 hours.

## 2018-04-03 ENCOUNTER — Encounter: Payer: Self-pay | Admitting: *Deleted

## 2018-07-03 ENCOUNTER — Other Ambulatory Visit: Payer: Self-pay | Admitting: Family Medicine

## 2018-07-03 ENCOUNTER — Telehealth: Payer: Self-pay | Admitting: Family Medicine

## 2018-07-03 MED ORDER — METFORMIN HCL 500 MG PO TABS: 500.0000 mg | ORAL_TABLET | Freq: Two times a day (BID) | ORAL | 1 refills | Status: DC

## 2018-07-03 NOTE — Telephone Encounter (Signed)
Changed to immediate release.  Will need to take BID with food.

## 2018-07-03 NOTE — Telephone Encounter (Signed)
Pt aware.

## 2018-08-01 ENCOUNTER — Encounter: Payer: Self-pay | Admitting: Family Medicine

## 2018-08-10 ENCOUNTER — Ambulatory Visit: Payer: Commercial Managed Care - PPO | Admitting: Family Medicine

## 2018-08-10 ENCOUNTER — Other Ambulatory Visit: Payer: Self-pay

## 2018-08-13 ENCOUNTER — Ambulatory Visit (INDEPENDENT_AMBULATORY_CARE_PROVIDER_SITE_OTHER): Payer: Commercial Managed Care - PPO | Admitting: Family Medicine

## 2018-08-13 ENCOUNTER — Encounter: Payer: Self-pay | Admitting: Family Medicine

## 2018-08-13 ENCOUNTER — Other Ambulatory Visit: Payer: Self-pay

## 2018-08-13 VITALS — BP 129/84 | HR 82 | Temp 97.8°F | Ht 62.0 in | Wt 174.0 lb

## 2018-08-13 DIAGNOSIS — Z6835 Body mass index (BMI) 35.0-35.9, adult: Secondary | ICD-10-CM | POA: Diagnosis not present

## 2018-08-13 DIAGNOSIS — E1169 Type 2 diabetes mellitus with other specified complication: Secondary | ICD-10-CM | POA: Diagnosis not present

## 2018-08-13 DIAGNOSIS — E119 Type 2 diabetes mellitus without complications: Secondary | ICD-10-CM | POA: Diagnosis not present

## 2018-08-13 DIAGNOSIS — G40109 Localization-related (focal) (partial) symptomatic epilepsy and epileptic syndromes with simple partial seizures, not intractable, without status epilepticus: Secondary | ICD-10-CM | POA: Diagnosis not present

## 2018-08-13 DIAGNOSIS — E785 Hyperlipidemia, unspecified: Secondary | ICD-10-CM

## 2018-08-13 LAB — BAYER DCA HB A1C WAIVED: HB A1C (BAYER DCA - WAIVED): 6.3 % (ref <7.0)

## 2018-08-13 NOTE — Progress Notes (Signed)
Subjective: CC: DM2 PCP: Janora Norlander, DO Suzanne Jimenez is a 58 y.o. female presenting to clinic today for:  1. Type 2 Diabetes w/ HLD:  Patient reports compliance with metformin 500 mg twice daily.  This was recently changed from the extended release secondary to recall.  She does note some increased diarrhea since the immediate release change but overall has been doing okay.  She has been trying to maintain a low-carb diet and increased exercise in efforts to improve both sugar and cholesterol.  Her fasting blood sugars over the last month have been running between 90s to low 100s but never above 110.  She has been taking Red rice yeast and OTC fish oils.  She has never been on a statin.  ASCVD risk score was 8.2%.   Last eye exam: Sees Dr. Marin Comment.  She is scheduling a visit for September. Last foot exam: Up-to-date Last A1c:  Lab Results  Component Value Date   HGBA1C 6.2 02/09/2018   Nephropathy screen indicated?:  Needs Last flu, zoster and/or pneumovax: Up-to-date Immunization History  Administered Date(s) Administered  . Influenza,inj,Quad PF,6+ Mos 10/17/2012, 10/09/2013, 10/14/2014, 10/24/2016  . Influenza,inj,quad, With Preservative 11/02/2017  . Influenza-Unspecified 10/26/1992, 10/25/1996, 12/13/1999, 11/20/2003, 10/13/2004, 11/07/2005, 11/10/2006, 10/01/2007, 10/23/2008, 10/27/2009, 11/02/2011, 10/17/2012, 10/09/2013, 10/14/2014  . Pneumococcal Polysaccharide-23 02/09/2018  . Tdap 07/29/2009    ROS: Denies dizziness, polyuria, polydipsia (though she reports she drinks a lot of water at baseline), unintended weight loss/gain.  No sensation changes.  No chest pain, shortness of breath.  2.  Eye lesion Patient reports a bump on the right lower lid.  She is not sure if this is a cyst.  She denies any pain or change in vision but wanted to have it evaluated.  3.  Seizure disorder Patient reports compliance with carbamazepine.  She has been seizure-free.  She  is tolerating medication without difficulty but asked that we check a Tegretol level for her today.  She sees Evlyn Courier at [facility].  ROS: Per HPI  Allergies  Allergen Reactions  . Dilantin [Phenytoin] Swelling    Swelling of lymph nodes  . Phenytoin Sodium Extended Rash   Past Medical History:  Diagnosis Date  . Abnormal Pap smear 1999  . Anemia   . Asthma   . BV (bacterial vaginosis) 05/07/2013  . Complication of anesthesia    Takes a while to wake up  . DM type 2 with diabetic mixed hyperlipidemia ([place]) 11/13/2017  . Hyperlipidemia   . Seizures ([place]) 1990  . Vaginal discharge 05/07/2013  . Vaginal Pap smear, abnormal     Current Outpatient Medications:  .  blood glucose meter kit and supplies, Dispense based on patient and insurance preference. Use up to four times daily as directed. (FOR ICD-10 E10.9, E11.9)., Disp: 1 each, Rfl: 0 .  carbamazepine (TEGRETOL) 200 MG tablet, Take 1 tablet (200 mg total) by mouth 2 (two) times daily., Disp: 180 tablet, Rfl: 3 .  cetirizine (ZYRTEC) 10 MG tablet, Take 10 mg by mouth daily., Disp: , Rfl:  .  ferrous sulfate 325 (65 FE) MG tablet, Take 325 mg by mouth daily with breakfast., Disp: , Rfl:  .  fish oil-omega-3 fatty acids 1000 MG capsule, Take 2 g by mouth daily., Disp: , Rfl:  .  glucose blood test strip, Test twice daily,, Disp: 100 each, Rfl: 12 .  Lancet Device MISC, Test twice daily, Disp: 100 each, Rfl: 12 .  metFORMIN (GLUCOPHAGE) 500 MG  tablet, Take 1 tablet (500 mg total) by mouth 2 (two) times daily with a meal., Disp: 180 tablet, Rfl: 1 .  Red Yeast Rice 600 MG CAPS, Take 600 mg by mouth daily., Disp: , Rfl:  .  augmented betamethasone dipropionate (DIPROLENE-AF) 0.05 % cream, APPLY SPARINGLY TWICE DAILY, Disp: , Rfl: 0 .  clotrimazole-betamethasone (LOTRISONE) cream, Apply 1 application topically 2 (two) times daily. x7-10 days (Patient not taking: Reported on 08/13/2018), Disp: 30 g, Rfl: 0 Social  History   Socioeconomic History  . Marital status: Married    Spouse name: Not on file  . Number of children: 2  . Years of education: college4  . Highest education level: Not on file  Occupational History  . 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 Financial resource strain: Not on file  . Food insecurity    Worry: Not on file    Inability: Not on file  . Transportation needs    Medical: Not on file    Non-medical: Not on file  Tobacco Use  . Smoking status: Never Smoker  . Smokeless tobacco: Never Used  Substance and Sexual Activity  . Alcohol use: No  . Drug use: No  . Sexual activity: Yes    Partners: Male    Birth control/protection: Surgical, Post-menopausal    Comment: tubal  Lifestyle  . Physical activity    Days per week: Not on file    Minutes per session: Not on file  . Stress: Not on file  Relationships  . Social Herbalist on phone: Not on file    Gets together: Not on file    Attends religious service: Not on file    Active member of club or organization: Not on file    Attends meetings of clubs or organizations: Not on file    Relationship status: Not on file  . Intimate partner violence    Fear of current or ex partner: Not on file    Emotionally abused: Not on file    Physically abused: Not on file    Forced sexual activity: Not on file  Other Topics Concern  . Not on file  Social History Narrative  . Not on file     Objective: Office vital signs reviewed. BP 129/84   Pulse 82   Temp 97.8 F (36.6 C) (Temporal)   Ht _0  (1.575 m)   Wt 174 lb (78.9 kg)   LMP 12/29/2013   BMI 31.83 kg/m   Physical Examination:  General: Awake, alert, well nourished, No acute distress HEENT: Normal, sclera white, MMM Cardio: regular rate and rhythm, S1S2 heard, no murmurs appreciated Pulm: clear to auscultation bilaterally, no wheezes, rhonchi or rales; normal work of  breathing on room air Extremities: warm, well perfused, No edema, cyanosis or clubbing; +2 pulses bilaterally Skin: She has several skin tags along the neck and face.  There is a 1.5 mm skin tag noted on the right lower lid laterally.  No appreciable drainage, erythema or induration.  No irritation noted.  Assessment/ Plan: 58 y.o. female   1. Type 2 diabetes mellitus without complication, without long-term current use of insulin (HCC) Controlled with slight increase in A1c of 6.3 today.  Continue metformin.  Check urine microalbumin - Bayer DCA Hb A1c Waived - Microalbumin/Creatinine Ratio, Urine  2. Hyperlipidemia associated with type 2 diabetes mellitus (HCC) Check fasting lipid panel.  We  discussed that ideally she should be on a statin given diabetes.  We will plan for Lipitor 20 mg daily but will go ahead and proceed with recheck of lipid panel given lifestyle modification. - CMP14+EGFR - Lipid Panel  3. BMI 35.0-35.9,adult Working on lifestyle changes  4. 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  Check carbamazepine level and CCT agrees with [facility], attention Evlyn Courier - Carbamazepine level, total    Orders Placed This Encounter  Procedures  . Bayer DCA Hb A1c Waived  . CMP14+EGFR  . Carbamazepine level, total  . Microalbumin/Creatinine Ratio, Urine   No orders of the defined types were placed in this encounter.    Janora Norlander, DO [place] [phone]

## 2018-08-13 NOTE — Patient Instructions (Addendum)
Please remember to get your diabetic eye exam done and have results sent to me.  You had labs performed today.  You will be contacted with the results of the labs once they are available, usually in the next 3 business days for routine lab work.  If you have an active my chart account, they will be released to your MyChart.  If you prefer to have these labs released to you via telephone, please let us know.  If you had a pap smear or biopsy performed, expect to be contacted in about 7-10 days.  We discussed cholesterol medication today.    Skin Tag, Adult  A skin tag (acrochordon) is a soft, extra growth of skin. Most skin tags are flesh-colored and rarely bigger than a pencil eraser. They commonly form near areas where there are folds in the skin, such as the armpit or groin. Skin tags are not dangerous, and they do not spread from person to person (are not contagious). You may have one skin tag or several. Skin tags do not require treatment. However, your health care provider may recommend removal of a skin tag if it:  Gets irritated from clothing.  Bleeds.  Is visible and unsightly. Your health care provider can remove skin tags with a simple surgical procedure or a procedure that involves freezing the skin tag. Follow these instructions at home:  Watch for any changes in your skin tag. A normal skin tag does not require any other special care at home.  Take over-the-counter and prescription medicines only as told by your health care provider.  Keep all follow-up visits as told by your health care provider. This is important. Contact a health care provider if:  You have a skin tag that: ? Becomes painful. ? Changes color. ? Bleeds. ? Swells.  You develop more skin tags. This information is not intended to replace advice given to you by your health care provider. Make sure you discuss any questions you have with your health care provider. Document Released: 01/11/2015 Document  Revised: 12/09/2016 Document Reviewed: 01/11/2015 Elsevier Patient Education  2020 [employer].

## 2018-08-14 ENCOUNTER — Other Ambulatory Visit: Payer: Self-pay | Admitting: Family Medicine

## 2018-08-14 DIAGNOSIS — E1169 Type 2 diabetes mellitus with other specified complication: Secondary | ICD-10-CM

## 2018-08-14 DIAGNOSIS — E785 Hyperlipidemia, unspecified: Secondary | ICD-10-CM

## 2018-08-14 LAB — LIPID PANEL
Chol/HDL Ratio: 4.8 ratio — ABNORMAL HIGH (ref 0.0–4.4)
Cholesterol, Total: 228 mg/dL — ABNORMAL HIGH (ref 100–199)
HDL: 48 mg/dL (ref >39)
LDL Calculated: 162 mg/dL — ABNORMAL HIGH (ref 0–99)
Triglycerides: 89 mg/dL (ref 0–149)
VLDL Cholesterol Cal: 18 mg/dL (ref 5–40)

## 2018-08-14 LAB — CMP14+EGFR
ALT: 19 IU/L (ref 0–32)
AST: 15 IU/L (ref 0–40)
Albumin/Globulin Ratio: 2.1 (ref 1.2–2.2)
Albumin: 4.8 g/dL (ref 3.8–4.9)
Alkaline Phosphatase: 94 IU/L (ref 39–117)
BUN/Creatinine Ratio: 17 (ref 9–23)
BUN: 12 mg/dL (ref 6–24)
Bilirubin Total: <0.2 mg/dL (ref 0.0–1.2)
CO2: 21 mmol/L (ref 20–29)
Calcium: 9.8 mg/dL (ref 8.7–10.2)
Chloride: 105 mmol/L (ref 96–106)
Creatinine, Ser: 0.7 mg/dL (ref 0.57–1.00)
GFR calc Af Amer: 112 mL/min/{1.73_m2} (ref >59)
GFR calc non Af Amer: 97 mL/min/{1.73_m2} (ref >59)
Globulin, Total: 2.3 g/dL (ref 1.5–4.5)
Glucose: 102 mg/dL — ABNORMAL HIGH (ref 65–99)
Potassium: 4 mmol/L (ref 3.5–5.2)
Sodium: 145 mmol/L — ABNORMAL HIGH (ref 134–144)
Total Protein: 7.1 g/dL (ref 6.0–8.5)

## 2018-08-14 LAB — MICROALBUMIN / CREATININE URINE RATIO
Creatinine, Urine: 220.9 mg/dL
Microalb/Creat Ratio: 23 mg/g creat (ref 0–29)
Microalbumin, Urine: 50.3 ug/mL

## 2018-08-14 LAB — CARBAMAZEPINE LEVEL, TOTAL: Carbamazepine (Tegretol), S: 6.9 ug/mL (ref 4.0–12.0)

## 2018-08-14 MED ORDER — ATORVASTATIN CALCIUM 20 MG PO TABS: 20.0000 mg | ORAL_TABLET | Freq: Every day | ORAL | 3 refills | Status: DC

## 2018-09-12 ENCOUNTER — Encounter: Payer: Self-pay | Admitting: Family Medicine

## 2018-11-05 ENCOUNTER — Other Ambulatory Visit: Payer: Self-pay

## 2018-11-05 ENCOUNTER — Encounter: Payer: Self-pay | Admitting: Neurology

## 2018-11-05 ENCOUNTER — Ambulatory Visit (INDEPENDENT_AMBULATORY_CARE_PROVIDER_SITE_OTHER): Payer: Commercial Managed Care - PPO | Admitting: Neurology

## 2018-11-05 VITALS — BP 122/82 | HR 80 | Temp 98.4°F | Ht 62.0 in | Wt 165.0 lb

## 2018-11-05 DIAGNOSIS — Z6835 Body mass index (BMI) 35.0-35.9, adult: Secondary | ICD-10-CM

## 2018-11-05 DIAGNOSIS — Z9189 Other specified personal risk factors, not elsewhere classified: Secondary | ICD-10-CM

## 2018-11-05 DIAGNOSIS — E119 Type 2 diabetes mellitus without complications: Secondary | ICD-10-CM

## 2018-11-05 DIAGNOSIS — G40109 Localization-related (focal) (partial) symptomatic epilepsy and epileptic syndromes with simple partial seizures, not intractable, without status epilepticus: Secondary | ICD-10-CM

## 2018-11-05 DIAGNOSIS — R0683 Snoring: Secondary | ICD-10-CM | POA: Diagnosis not present

## 2018-11-05 NOTE — Progress Notes (Signed)
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  HISTORY FROM: Patient    Rv : 11-05-2018, patient is a 58 year old seizures patient wih good control of seizures after surgery. 25 years seizure free on Tegretol.  She has a WBC and sodium levels once a year with dr Lajuana Ripple, DO and she has no need for tegretol levels as a well controlled patient.    Suzanne Jimenez, 58 -year-old female returns today for yearly followup. 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 She had surgical removal of the AV malformation on the left and seizures have been under good control since that time. She is currently on Tegretol 250m twice daily. No seizure activity in 22 years .She denies any side effects to the medication, no daytime drowsiness, no feelings of being off balance. She denies missing any doses. She denies any headache. She has had no falls. Appetite is good, sleeping well. No new neurologic complaints. Recent CBZ level was 6.6 on 05/19/17.    She returns for reevaluation.  She has just retired.   REVIEW OF SYSTEMS; Allergy/Immunology: Environmental allergies Neurological: History of seizure disorder Psychiatric: neg Sleep : neg   ALLERGIES: Allergies  Allergen Reactions  . Dilantin [Phenytoin] Swelling    Swelling of lymph nodes  . Phenytoin Sodium Extended Rash    HOME MEDICATIONS: Outpatient Medications Prior to Visit  Medication Sig Dispense Refill  . blood glucose meter kit and supplies Dispense based on patient and insurance preference. Use up to four times daily as directed. (FOR ICD-10 E10.9, E11.9). 1 each 0  . carbamazepine (TEGRETOL) 200 MG tablet Take 1 tablet (200 mg total) by mouth 2 (two) times daily. 180 tablet 3  . cetirizine (ZYRTEC) 10 MG tablet Take 10 mg by mouth daily.    . ferrous sulfate 325 (65 FE) MG tablet Take 325 mg by mouth daily  with breakfast.    . fish oil-omega-3 fatty acids 1000 MG capsule Take 2 g by mouth daily.    .Marland Kitchenglucose blood test strip Test twice daily, 100 each 12  . Lancet Device MISC Test twice daily 100 each 12  . metFORMIN (GLUCOPHAGE) 500 MG tablet Take 1 tablet (500 mg total) by mouth 2 (two) times daily with a meal. 180 tablet 1  . Red Yeast Rice 600 MG CAPS Take 600 mg by mouth daily.    .Marland Kitchenatorvastatin (LIPITOR) 20 MG tablet Take 1 tablet (20 mg total) by mouth daily. 90 tablet 3  . augmented betamethasone dipropionate (DIPROLENE-AF) 0.05 % cream APPLY SPARINGLY TWICE DAILY  0  . clotrimazole-betamethasone (LOTRISONE) cream Apply 1 application topically 2 (two) times daily. x7-10 days 30 g 0   No facility-administered medications prior to visit.     PAST MEDICAL HISTORY: Past Medical History:  Diagnosis Date  . Abnormal Pap smear 1999  . Anemia   . Asthma   . BV (bacterial vaginosis) 05/07/2013  . Complication of anesthesia    Takes a while to wake up  . DM type 2 with diabetic mixed hyperlipidemia ([place] 11/13/2017  . Hyperlipidemia   . [place]izures ([place] 1990  . Vaginal discharge 05/07/2013  . Vaginal Pap smear, abnormal     PAST SURGICAL HISTORY: Past Surgical History:  Procedure Laterality Date  . BRAIN SURGERY  1990  . CESAREAN SECTION  2004  . COLONOSCOPY N/A 03/13/2013   Procedure:  COLONOSCOPY;  Surgeon: Rogene Houston, MD;  Location: [facility];  Service: Endoscopy;  Laterality: N/A;  830  . GYNECOLOGIC CRYOSURGERY  1999   Abnormal Pap  . TUBAL LIGATION      FAMILY HISTORY: Family History  Problem Relation Age of Onset  . Diabetes Mother   . Hypertension Mother   . Diabetes Father   . Heart disease Father   . Hypertension Father   . Heart disease Brother   . Diabetes Maternal Aunt   . Hypertension Maternal Aunt   . Diabetes Maternal Uncle   . Hypertension Maternal Uncle   . Colon cancer Neg Hx     SOCIAL HISTORY: Social History   Socioeconomic History  .  Marital status: Married    Spouse name: Not on file  . Number of children: 2  . Years of education: college4  . Highest education level: Not on file  Occupational History  . 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 Financial resource strain: Not on file  . Food insecurity    Worry: Not on file    Inability: Not on file  . Transportation needs    Medical: Not on file    Non-medical: Not on file  Tobacco Use  . Smoking status: Never Smoker  . Smokeless tobacco: Never Used  Substance and Sexual Activity  . Alcohol use: No  . Drug use: No  . Sexual activity: Yes    Partners: Male    Birth control/protection: Surgical, Post-menopausal    Comment: tubal  Lifestyle  . Physical activity    Days per week: Not on file    Minutes per session: Not on file  . Stress: Not on file  Relationships  . Social Herbalist on phone: Not on file    Gets together: Not on file    Attends religious service: Not on file    Active member of club or organization: Not on file    Attends meetings of clubs or organizations: Not on file    Relationship status: Not on file  . Intimate partner violence    Fear of current or ex partner: Not on file    Emotionally abused: Not on file    Physically abused: Not on file    Forced sexual activity: Not on file  Other Topics Concern  . Not on file  Social History Narrative  . Not on file     PHYSICAL EXAM  Vitals:   11/05/18 1040  BP: 122/82  Pulse: 80  Temp: 98.4 F (36.9 C)  Weight: 165 lb (74.8 kg)  Height: '5\' 2"'  (1.575 m)   Body mass index is 30.18 kg/m. Generalized: Well developed, in no acute distress  Head: normocephalic and atraumatic,. Oropharynx benign  Neck: Supple,  Musculoskeletal: No deformity  Neurological examination  Mentation: Alert oriented to time, place, history taking. Follows all commands speech and language fluent  Cranial nerve : no  loss of taste or smell. Pupils were equal , round,  reactive to light and consensual reaction. Her . extraocular movements were full,  visual field were full on confrontational test. Facial sensation and strength were normal. hearing was intact to finger rubbing bilaterally. Uvula and tongue were both in midline. Full ROM, head turning and shoulder shrug and were normal and symmetric.Tongue protrusion into cheek strength was deferred.  Motor: normal bulk and tone, full strength. The fine finger  movements are normal, no pronator drift.  Coordination: finger-nose-finger, heel-to-shin bilaterally, no dysmetria  Reflexes: Symmetric bilaterally.  Gait and Station: Rising up from seated position without assistance, normal stance, moderate width and turning with 3. 5 steps.  Her posture is notable, prominent chest and lordosis, and slight hump.   DIAGNOSTIC DATA (LABS, IMAGING, TESTING) - I reviewed patient records, labs, notes, testing and imaging myself where available.  Lab Results  Component Value Date   WBC 10.4 02/25/2018   HGB 12.1 02/25/2018   HCT 39.0 02/25/2018   MCV 87.6 02/25/2018   PLT 285 02/25/2018      Component Value Date/Time   NA 145 (H) 08/13/2018 1159   K 4.0 08/13/2018 1159   CL 105 08/13/2018 1159   CO2 21 08/13/2018 1159   GLUCOSE 102 (H) 08/13/2018 1159   GLUCOSE 120 (H) 02/25/2018 1913   BUN 12 08/13/2018 1159   CREATININE 0.70 08/13/2018 1159   CALCIUM 9.8 08/13/2018 1159   PROT 7.1 08/13/2018 1159   ALBUMIN 4.8 08/13/2018 1159   AST 15 08/13/2018 1159   ALT 19 08/13/2018 1159   ALKPHOS 94 08/13/2018 1159   BILITOT <0.2 08/13/2018 1159   GFRNONAA 97 08/13/2018 1159   GFRAA 112 08/13/2018 1159       ASSESSMENT AND PLAN  58 y.o. year old female  has a past medical history of Seizures ([place]) (1990);  and Carpal tunnel syndrome, bilateral (09/2014). here to follow-up. Seizure disorder is in good control.  She also has a history of AV malformation on the  left  that was surgically removed.   She has been 25 years without seizure activity ;  Longstanding tegretol/ CMZ therapy-  Will need a bone scan for bone density soon.   Most recent CBZ level 6.6 on 05/19/17=  Will not repeat.  Only CMET and CBC diff are needed.  Continue Tegretol 245m twice daily will refill Call for seizure activity F/U yearly and prn with NP.    CLarey Seat MD 11-05-2018  [facility] [address] [place]of[place] [phone]

## 2018-11-05 NOTE — Patient Instructions (Signed)
She has been 25 years without seizure activity ;  Longstanding tegretol/ CMZ therapy-  Will need a bone scan for bone density soon.   Most recent CBZ level 6.6 on 05/19/17=  Will not repeat.  Only CMET and CBC diff are needed.  Continue Tegretol 200mg  twice daily will refill Call for seizure activity F/U yearly and prn with NP.  Carbamazepine tablets What is this medicine? CARBAMAZEPINE (kar ba MAZ e peen) is used to control seizures caused by certain types of epilepsy. This medicine is also used to treat nerve related pain. It is not for common aches and pains. This medicine may be used for other purposes; ask your health care provider or pharmacist if you have questions. COMMON BRAND NAME(S): Epitol, Tegretol What should I tell my health care provider before I take this medicine? They need to know if you have any of these conditions:  Asian ancestry  bone marrow disease  glaucoma  heart disease or irregular heartbeat  kidney disease  liver disease  low blood counts, like low white cell, platelet, or red cell counts  porphyria  psychotic disorders  suicidal thoughts, plans, or attempt; a previous suicide attempt by you or a family member  an unusual or allergic reaction to carbamazepine, tricyclic antidepressants, phenytoin, phenobarbital or other medicines, foods, dyes, or preservatives  pregnant or trying to get pregnant  breast-feeding How should I use this medicine? Take this medicine by mouth with a glass of water. Follow the directions on the prescription label. Take this medicine with food. Take your doses at regular intervals. Do not take your medicine more often than directed. Do not stop taking this medicine except on the advice of your doctor or health care professional. A special MedGuide will be given to you by the pharmacist with each prescription and refill. Be sure to read this information carefully each time. Talk to your pediatrician regarding the use of  this medicine in children. Special care may be needed. Overdosage: If you think you have taken too much of this medicine contact a poison control center or emergency room at once. NOTE: This medicine is only for you. Do not share this medicine with others. What if I miss a dose? If you miss a dose, take it as soon as you can. If it is almost time for your next dose, take only that dose. Do not take double or extra doses. What may interact with this medicine? Do not take this medicine with any of the following medications:  certain medicines used to treat HIV infection or AIDS that are given in combination with cobicistat   delavirdine   MAOIs like Carbex, Eldepryl, Marplan, Nardil, and Parnate   nefazodone   oxcarbazepine This medicine may also interact with the following medications:   acetaminophen   acetazolamide   barbiturate medicines for inducing sleep or treating seizures, like phenobarbital   certain antibiotics like clarithromycin, erythromycin or troleandomycin   cimetidine   cyclosporine   danazol   dicumarol   doxycycline   female hormones, including estrogens and birth control pills   grapefruit juice   isoniazid, INH   levothyroxine and other thyroid hormones   lithium and other medicines to treat mood problems or psychotic disturbances   loratadine   medicines for angina or high blood pressure   medicines for cancer   medicines for depression or anxiety   medicines for sleep   medicines to treat fungal infections, like fluconazole, itraconazole or ketoconazole   medicines used  to treat HIV infection or AIDS   methadone   niacinamide   praziquantel   propoxyphene   rifampin or rifabutin   seizure or epilepsy medicine   steroid medicines such as prednisone or cortisone   theophylline   tramadol   warfarin This list may not describe all possible interactions. Give your health care provider a  list of all the medicines, herbs, non-prescription drugs, or dietary supplements you use. Also tell them if you smoke, drink alcohol, or use illegal drugs. Some items may interact with your medicine. What should I watch for while using this medicine? Visit your doctor or health care provider for a regular check on your progress. Do not change brands or dosage forms of this medicine without discussing the change with your doctor or health care provider. If you are taking this medicine for epilepsy (seizures), do not stop taking it suddenly. This increases the risk of seizures. Wear a Probation officer or necklace. Carry an identification card with information about your condition, medications, and doctor or health care provider. This medicine may cause serious skin reactions. They can happen weeks to months after starting the medicine. Contact your health care provider right away if you notice fevers or flu-like symptoms with a rash. The rash may be red or purple and then turn into blisters or peeling of the skin. Or, you might notice a red rash with swelling of the face, lips or lymph nodes in your neck or under your arms. You may get drowsy, dizzy, or have blurred vision. Do not drive, use machinery, or do anything that needs mental alertness until you know how this medicine affects you. To reduce dizzy or fainting spells, do not sit or stand up quickly, especially if you are an older patient. Alcohol can increase drowsiness and dizziness. Avoid alcoholic drinks. Birth control pills may not work properly while you are taking this medicine. Talk to your doctor about using an extra method of birth control. This medicine can make you more sensitive to the sun. Keep out of the sun. If you cannot avoid being in the sun, wear protective clothing and use sunscreen. Do not use sun lamps or tanning beds/booths. The use of this medicine may increase the chance of suicidal thoughts or actions. Pay special attention  to how you are responding while on this medicine. Any worsening of mood, or thoughts of suicide or dying should be reported to your health care provider right away. Women who become pregnant while using this medicine may enroll in the [place] Pregnancy Registry by calling [phone]. This registry collects information about the safety of antiepileptic drug use during pregnancy. This medicine may cause a decrease in vitamin D and folic acid. You should make sure that you get enough vitamins while you are taking this medicine. Discuss the foods you eat and the vitamins you take with your health care provider. What side effects may I notice from receiving this medicine? Side effects that you should report to your doctor or health care professional as soon as possible:  allergic reactions like skin rash, itching or hives, swelling of the face, lips, or tongue  breathing problems  changes in vision  confusion  dark urine  fast or irregular heartbeat  fever or chills, sore throat  mouth ulcers  pain or difficulty passing urine  rash, fever, and swollen lymph nodes  redness, blistering, peeling or loosening of the skin, including inside the mouth  ringing in the ears  seizures  stomach pain  swollen joints or muscle/joint aches and pains  unusual bleeding or bruising  unusually weak or tired  vomiting  worsening of mood, thoughts or actions of suicide or dying  yellowing of the eyes or skin Side effects that usually do not require medical attention (report to your doctor or health care professional if they continue or are bothersome):  clumsiness or unsteadiness  diarrhea or constipation  headache  increased sweating  nausea This list may not describe all possible side effects. Call your doctor for medical advice about side effects. You may report side effects to FDA at 1-800-FDA-1088. Where should I keep my medicine? Keep out of reach  of children. Store at room temperature below 30 degrees C (86 degrees F). Keep container tightly closed. Protect from moisture. Throw away any unused medicine after the expiration date. NOTE: This sheet is a summary. It may not cover all possible information. If you have questions about this medicine, talk to your doctor, pharmacist, or health care provider.  2020 Elsevier/Gold Standard (2018-03-29 09:05:49)

## 2018-12-10 ENCOUNTER — Other Ambulatory Visit: Payer: Self-pay | Admitting: Family Medicine

## 2018-12-10 ENCOUNTER — Telehealth: Payer: Self-pay | Admitting: Neurology

## 2018-12-10 DIAGNOSIS — G40109 Localization-related (focal) (partial) symptomatic epilepsy and epileptic syndromes with simple partial seizures, not intractable, without status epilepticus: Secondary | ICD-10-CM

## 2018-12-10 MED ORDER — CARBAMAZEPINE 200 MG PO TABS: 200.0000 mg | ORAL_TABLET | Freq: Two times a day (BID) | ORAL | 3 refills | Status: DC

## 2018-12-10 NOTE — Telephone Encounter (Signed)
1) Medication(s) Requested (by name): carbamazepine (TEGRETOL) 200 MG tablet   2) Pharmacy of Choice:  [facility] [address] [place] [address] [address]

## 2018-12-10 NOTE — Telephone Encounter (Signed)
I have sent the refill for the patient to pharmacy as requested.

## 2019-02-13 ENCOUNTER — Ambulatory Visit: Payer: Commercial Managed Care - PPO | Admitting: Family Medicine

## 2019-02-22 ENCOUNTER — Other Ambulatory Visit: Payer: Self-pay

## 2019-02-25 ENCOUNTER — Ambulatory Visit: Payer: Commercial Managed Care - PPO | Admitting: Family Medicine

## 2019-03-15 ENCOUNTER — Other Ambulatory Visit: Payer: Self-pay

## 2019-03-18 ENCOUNTER — Ambulatory Visit (INDEPENDENT_AMBULATORY_CARE_PROVIDER_SITE_OTHER): Payer: Commercial Managed Care - PPO | Admitting: Family Medicine

## 2019-03-18 ENCOUNTER — Ambulatory Visit (INDEPENDENT_AMBULATORY_CARE_PROVIDER_SITE_OTHER): Payer: Commercial Managed Care - PPO

## 2019-03-18 ENCOUNTER — Encounter: Payer: Self-pay | Admitting: Family Medicine

## 2019-03-18 ENCOUNTER — Other Ambulatory Visit: Payer: Self-pay

## 2019-03-18 VITALS — BP 120/80 | HR 87 | Temp 98.1°F | Ht 62.0 in | Wt 163.0 lb

## 2019-03-18 DIAGNOSIS — L820 Inflamed seborrheic keratosis: Secondary | ICD-10-CM

## 2019-03-18 DIAGNOSIS — G40109 Localization-related (focal) (partial) symptomatic epilepsy and epileptic syndromes with simple partial seizures, not intractable, without status epilepticus: Secondary | ICD-10-CM

## 2019-03-18 DIAGNOSIS — H6121 Impacted cerumen, right ear: Secondary | ICD-10-CM

## 2019-03-18 DIAGNOSIS — Z780 Asymptomatic menopausal state: Secondary | ICD-10-CM

## 2019-03-18 DIAGNOSIS — E119 Type 2 diabetes mellitus without complications: Secondary | ICD-10-CM | POA: Diagnosis not present

## 2019-03-18 DIAGNOSIS — Z0000 Encounter for general adult medical examination without abnormal findings: Secondary | ICD-10-CM | POA: Diagnosis not present

## 2019-03-18 DIAGNOSIS — E1169 Type 2 diabetes mellitus with other specified complication: Secondary | ICD-10-CM

## 2019-03-18 DIAGNOSIS — E785 Hyperlipidemia, unspecified: Secondary | ICD-10-CM | POA: Diagnosis not present

## 2019-03-18 LAB — BAYER DCA HB A1C WAIVED: HB A1C (BAYER DCA - WAIVED): 6.3 % (ref <7.0)

## 2019-03-18 MED ORDER — METFORMIN HCL 500 MG PO TABS: ORAL_TABLET | ORAL | 1 refills | Status: DC

## 2019-03-18 MED ORDER — MECLIZINE HCL 25 MG PO TABS: 25.0000 mg | ORAL_TABLET | Freq: Three times a day (TID) | ORAL | 0 refills | Status: DC | PRN

## 2019-03-18 NOTE — Progress Notes (Signed)
Subjective: CC: DM2, hyperlipidemia PCP: Janora Norlander, DO LAG:TXMIWOEH Suzanne Jimenez is a 59 y.o. female presenting to clinic today for:  1. Type 2 Diabetes w/ HLD; dizziness:  Patient reports compliance with metformin 500 mg twice daily.  She has been tolerating the twice daily dosing without difficulty.  She continues to watch her diet and tries to stay physically active.  She does report some episodes of dizziness.  She had an episode this morning.  She describes the dizziness as room spinning.  Symptoms have gotten better on her own.  She denies any hearing loss, ear pain.  She does report some fluidlike sensation in the right ear occasionally.  Last eye exam: Sees Dr. Marin Comment.  Had done. Last foot exam: Needs Last A1c:  Lab Results  Component Value Date   HGBA1C 6.3 08/13/2018   Nephropathy screen indicated?:  Up-to-date Last flu, zoster and/or pneumovax: Up-to-date Immunization History  Administered Date(s) Administered  . Influenza,inj,Quad PF,6+ Mos 10/17/2012, 10/09/2013, 10/14/2014, 10/24/2016  . Influenza,inj,quad, With Preservative 11/02/2017  . Influenza-Unspecified 10/26/1992, 10/25/1996, 12/13/1999, 11/20/2003, 10/13/2004, 11/07/2005, 11/10/2006, 10/01/2007, 10/23/2008, 10/27/2009, 11/02/2011, 10/17/2012, 10/09/2013, 10/14/2014  . Pneumococcal Polysaccharide-23 02/09/2018  . Tdap 07/29/2009    ROS: Denies polyuria, polydipsia, unintended weight loss/gain.  No sensation changes.  No chest pain, shortness of breath.  2.  Skin lesion Patient reports a lesion on her mid upper back that has been irritated and seems to be more raised than the other lesions on her back.  She would like to have me look at this.  Denies any spontaneous bleeding.  3.  Seizure disorder Patient reports compliance with her carbamazepine.  Does not report any seizure episodes since her last visit.  ROS: Per HPI  Allergies  Allergen Reactions  . Dilantin [Phenytoin] Swelling    Swelling of  lymph nodes  . Phenytoin Sodium Extended Rash   Past Medical History:  Diagnosis Date  . Abnormal Pap smear 1999  . Anemia   . Asthma   . BV (bacterial vaginosis) 05/07/2013  . Complication of anesthesia    Takes a while to wake up  . DM type 2 with diabetic mixed hyperlipidemia ([place]) 11/13/2017  . Hyperlipidemia   . Seizures ([place]) 1990  . Vaginal discharge 05/07/2013  . Vaginal Pap smear, abnormal     Current Outpatient Medications:  .  blood glucose meter kit and supplies, Dispense based on patient and insurance preference. Use up to four times daily as directed. (FOR ICD-10 E10.9, E11.9)., Disp: 1 each, Rfl: 0 .  carbamazepine (TEGRETOL) 200 MG tablet, Take 1 tablet (200 mg total) by mouth 2 (two) times daily., Disp: 180 tablet, Rfl: 3 .  cetirizine (ZYRTEC) 10 MG tablet, Take 10 mg by mouth daily., Disp: , Rfl:  .  ferrous sulfate 325 (65 FE) MG tablet, Take 325 mg by mouth daily with breakfast., Disp: , Rfl:  .  fish oil-omega-3 fatty acids 1000 MG capsule, Take 2 g by mouth daily., Disp: , Rfl:  .  glucose blood test strip, Test twice daily,, Disp: 100 each, Rfl: 12 .  Lancet Device MISC, Test twice daily, Disp: 100 each, Rfl: 12 .  metFORMIN (GLUCOPHAGE) 500 MG tablet, TAKE 1 TABLET BY MOUTH TWICE DAILY WITH A MEAL, Disp: 180 tablet, Rfl: 0 .  Red Yeast Rice 600 MG CAPS, Take 600 mg by mouth daily., Disp: , Rfl:  Social History   Socioeconomic History  . Marital status: Married    Spouse name: Not on  file  . Number of children: 2  . Years of education: college4  . Highest education level: Not on file  Occupational History  . 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 Smoking status: Never Smoker  . Smokeless tobacco: Never Used  Substance and Sexual Activity  . Alcohol use: No  . Drug use: No  . Sexual activity: Yes    Partners: Male    Birth control/protection: Surgical, Post-menopausal    Comment: tubal   Other Topics Concern  . Not on file  Social History Narrative  . Not on file   Social Determinants of Health   Financial Resource Strain:   . Difficulty of Paying Living Expenses: Not on file  Food Insecurity:   . Worried About Charity fundraiser in the Last Year: Not on file  . Ran Out of Food in the Last Year: Not on file  Transportation Needs:   . Lack of Transportation (Medical): Not on file  . Lack of Transportation (Non-Medical): Not on file  Physical Activity:   . Days of Exercise per Week: Not on file  . Minutes of Exercise per Session: Not on file  Stress:   . Feeling of Stress : Not on file  Social Connections:   . Frequency of Communication with Friends and Family: Not on file  . Frequency of Social Gatherings with Friends and Family: Not on file  . Attends Religious Services: Not on file  . Active Member of Clubs or Organizations: Not on file  . Attends Archivist Meetings: Not on file  . Marital Status: Not on file  Intimate Partner Violence:   . Fear of Current or Ex-Partner: Not on file  . Emotionally Abused: Not on file  . Physically Abused: Not on file  . Sexually Abused: Not on file     Objective: Office vital signs reviewed. BP 120/80   Pulse 87   Temp 98.1 F (36.7 C) (Temporal)   Ht '5\' 2"'  (1.575 m)   Wt 163 lb (73.9 kg)   LMP 12/29/2013   SpO2 98%   BMI 29.81 kg/m   Physical Examination:  General: Awake, alert, well nourished, No acute distress HEENT: Normal, sclera white, MMM Cardio: regular rate and rhythm, S1S2 heard, no murmurs appreciated Pulm: clear to auscultation bilaterally, no wheezes, rhonchi or rales; normal work of breathing on room air Extremities: warm, well perfused, No edema, cyanosis or clubbing; +2 pulses bilaterally Skin: She has several pigmented seborrheic keratoses noted along her back.  She has 1 hyperkeratotic lesion just near the right scapula border.  This is not bleeding.  No surrounding  erythema. Neuro: See DM foot below; no nystagmus.  Diabetic Foot Exam - Simple   Simple Foot Form Diabetic Foot exam was performed with the following findings: Yes 03/18/2019  3:57 PM  Visual Inspection No deformities, no ulcerations, no other skin breakdown bilaterally: Yes Sensation Testing Intact to touch and monofilament testing bilaterally: Yes Pulse Check Posterior Tibialis and Dorsalis pulse intact bilaterally: Yes Comments Vibratory sensation intact bilaterally.  No calluses or preulcerative calluses noted    Cryotherapy Procedure:  Risks and benefits of procedure were reviewed with the patient.  Written consent obtained and scanned into the chart.  Lesion of concern was identified and located on right mid upper back.  Liquid nitrogen was applied to area of concern and extending out 1 millimeters beyond the border of the lesion.  Treated area was allowed to come back to room temperature before treating it a second time.  Patient tolerated procedure well and there were no immediate complications.  Home care instructions were reviewed with the patient and a handout was provided.  Assessment/ Plan: 59 y.o. female   1. Type 2 diabetes mellitus without complication, without long-term current use of insulin ([place]) At goal with A1c of 6.3.  No changes to regimen made today.  Will obtain her most recent eye exam.  Foot exam updated - Bayer DCA Hb A1c Waived  2. Hyperlipidemia associated with type 2 diabetes mellitus (HCC) - CMP14+EGFR - LDL Cholesterol, Direct  3. 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  Check CBC per yearly recommendations of neurology - CBC with Differential  4. Asymptomatic postmenopausal estrogen deficiency Check DEXA scan - DG [facility] DEXA; Future  5. Inflamed seborrheic keratosis Treated with cryotherapy.  Patient tolerated procedure without difficulty.  Handout provided for home care  6. Impacted cerumen of right  ear Irrigated.  TM intact.  No evidence of infection.  Possibly the cause of her vertigo.  Though I did go ahead and send in meclizine to have on hand just in case.  She understands red flag signs and symptoms warranting further evaluation    Orders Placed This Encounter  Procedures  . DG [facility] DEXA    Standing Status:   Future    Number of Occurrences:   1    Standing Expiration Date:   05/17/2020    Order Specific Question:   Reason for Exam (SYMPTOM  OR DIAGNOSIS REQUIRED)    Answer:   screen osteoporosis    Order Specific Question:   Is the patient pregnant?    Answer:   No  . CMP14+EGFR  . LDL Cholesterol, Direct  . Bayer DCA Hb A1c Waived  . CBC with Differential   Meds ordered this encounter  Medications  . metFORMIN (GLUCOPHAGE) 500 MG tablet    Sig: TAKE 1 TABLET BY MOUTH TWICE DAILY WITH A MEAL    Dispense:  180 tablet    Refill:  1  . meclizine (ANTIVERT) 25 MG tablet    Sig: Take 1 tablet (25 mg total) by mouth 3 (three) times daily as needed for dizziness.    Dispense:  30 tablet    Refill:  [place], DO [place] [phone]

## 2019-03-18 NOTE — Patient Instructions (Addendum)
You had labs performed today.  You will be contacted with the results of the labs once they are available, usually in the next 3 business days for routine lab work.  If you have an active my chart account, they will be released to your MyChart.  If you prefer to have these labs released to you via telephone, please let us know.  If you had a pap smear or biopsy performed, expect to be contacted in about 7-10 days.   Cryosurgery for Skin Conditions, Care After This sheet gives you information about how to care for yourself after your procedure. Your health care provider may also give you more specific instructions. If you have problems or questions, contact your health care provider. What can I expect after the procedure? After your procedure, it is common to have redness, swelling, and a blister that forms over the treated area. The blister may contain a small amount of blood. After about 2 weeks, the blister will break on its own, leaving a scab. Then the treated area will heal. After healing, there is usually little or no scarring. Follow these instructions at home: Caring for the treated area   Follow instructions from your health care provider about how to take care of the treated area. Make sure you: ? Keep the area covered with a bandage (dressing) until it heals, or for as long as told by your health care provider. ? Wash your hands with soap and water before you change your dressing. If soap and water are not available, use hand sanitizer. ? Change your dressing as told by your health care provider. ? Keep the dressing and the treated area clean and dry. If the dressing gets wet, change it right away. ? Clean the treated area with soap and water.  Check the treated area every day for signs of infection. Check for: ? More redness, swelling, or pain. ? More fluid or blood. ? Warmth. ? Pus or a bad smell. General instructions  Do not pick at your blister or try to break it open. This  can cause infection and scarring.  Do not apply any medicine, cream, or lotion to the treated area unless directed by your health care provider.  Take over-the-counter and prescription medicines only as told by your health care provider.  Keep all follow-up visits as told by your health care provider. This is important. Contact a health care provider if:  You have more redness, swelling, or pain around the treated area.  You have more fluid or blood coming from the treated area.  The treated area feels warm to the touch.  You have pus or a bad smell coming from the treated area.  Your blister becomes large and painful. Get help right away if:  You have a fever and have redness spreading from the treated area. Summary  The treated area will become red and swollen shortly after the procedure.  You should keep the treated area and your dressing clean and dry.  Check the treated area every day for signs of infection, such as fluid, pus, warmth, or having more redness, swelling, or pain.  Do not pick at your blister or try to break it open. This information is not intended to replace advice given to you by your health care provider. Make sure you discuss any questions you have with your health care provider. Document Revised: 12/09/2016 Document Reviewed: 11/16/2015 Elsevier Patient Education  2020 Elsevier Inc.  

## 2019-03-19 LAB — CBC WITH DIFFERENTIAL/PLATELET
Basophils Absolute: 0 10*3/uL (ref 0.0–0.2)
Basos: 1 %
EOS (ABSOLUTE): 0.2 10*3/uL (ref 0.0–0.4)
Eos: 2 %
Hematocrit: 34.7 % (ref 34.0–46.6)
Hemoglobin: 11.4 g/dL (ref 11.1–15.9)
Immature Grans (Abs): 0 10*3/uL (ref 0.0–0.1)
Immature Granulocytes: 0 %
Lymphocytes Absolute: 3 10*3/uL (ref 0.7–3.1)
Lymphs: 42 %
MCH: 27.9 pg (ref 26.6–33.0)
MCHC: 32.9 g/dL (ref 31.5–35.7)
MCV: 85 fL (ref 79–97)
Monocytes Absolute: 0.6 10*3/uL (ref 0.1–0.9)
Monocytes: 8 %
Neutrophils Absolute: 3.3 10*3/uL (ref 1.4–7.0)
Neutrophils: 47 %
Platelets: 297 10*3/uL (ref 150–450)
RBC: 4.09 x10E6/uL (ref 3.77–5.28)
RDW: 14 % (ref 11.7–15.4)
WBC: 7.1 10*3/uL (ref 3.4–10.8)

## 2019-03-19 LAB — CMP14+EGFR
ALT: 15 IU/L (ref 0–32)
AST: 12 IU/L (ref 0–40)
Albumin/Globulin Ratio: 1.8 (ref 1.2–2.2)
Albumin: 4.6 g/dL (ref 3.8–4.9)
Alkaline Phosphatase: 100 IU/L (ref 39–117)
BUN/Creatinine Ratio: 17 (ref 9–23)
BUN: 13 mg/dL (ref 6–24)
Bilirubin Total: <0.2 mg/dL (ref 0.0–1.2)
CO2: 24 mmol/L (ref 20–29)
Calcium: 9.7 mg/dL (ref 8.7–10.2)
Chloride: 104 mmol/L (ref 96–106)
Creatinine, Ser: 0.76 mg/dL (ref 0.57–1.00)
GFR calc Af Amer: 101 mL/min/{1.73_m2} (ref >59)
GFR calc non Af Amer: 87 mL/min/{1.73_m2} (ref >59)
Globulin, Total: 2.5 g/dL (ref 1.5–4.5)
Glucose: 86 mg/dL (ref 65–99)
Potassium: 4.3 mmol/L (ref 3.5–5.2)
Sodium: 142 mmol/L (ref 134–144)
Total Protein: 7.1 g/dL (ref 6.0–8.5)

## 2019-03-19 LAB — LDL CHOLESTEROL, DIRECT: LDL Direct: 154 mg/dL — ABNORMAL HIGH (ref 0–99)

## 2019-03-19 MED ORDER — ATORVASTATIN CALCIUM 20 MG PO TABS: 20.0000 mg | ORAL_TABLET | Freq: Every day | ORAL | 3 refills | Status: DC

## 2019-03-19 NOTE — Addendum Note (Signed)
Addended by: Adella Hare B on: 03/19/2019 02:18 PM   Modules accepted: Orders

## 2019-03-19 NOTE — Addendum Note (Signed)
Addended by: Adella Hare B on: 03/19/2019 02:20 PM   Modules accepted: Orders

## 2019-09-20 ENCOUNTER — Ambulatory Visit (INDEPENDENT_AMBULATORY_CARE_PROVIDER_SITE_OTHER): Payer: Commercial Managed Care - PPO | Admitting: Family Medicine

## 2019-09-20 ENCOUNTER — Encounter: Payer: Self-pay | Admitting: Family Medicine

## 2019-09-20 ENCOUNTER — Other Ambulatory Visit: Payer: Self-pay

## 2019-09-20 VITALS — BP 115/79 | HR 69 | Temp 97.7°F | Ht 62.0 in | Wt 183.6 lb

## 2019-09-20 DIAGNOSIS — E119 Type 2 diabetes mellitus without complications: Secondary | ICD-10-CM

## 2019-09-20 DIAGNOSIS — E785 Hyperlipidemia, unspecified: Secondary | ICD-10-CM

## 2019-09-20 DIAGNOSIS — E559 Vitamin D deficiency, unspecified: Secondary | ICD-10-CM

## 2019-09-20 DIAGNOSIS — R600 Localized edema: Secondary | ICD-10-CM

## 2019-09-20 DIAGNOSIS — E1169 Type 2 diabetes mellitus with other specified complication: Secondary | ICD-10-CM | POA: Diagnosis not present

## 2019-09-20 DIAGNOSIS — G40109 Localization-related (focal) (partial) symptomatic epilepsy and epileptic syndromes with simple partial seizures, not intractable, without status epilepticus: Secondary | ICD-10-CM

## 2019-09-20 LAB — BAYER DCA HB A1C WAIVED: HB A1C (BAYER DCA - WAIVED): 6.3 % (ref <7.0)

## 2019-09-20 MED ORDER — HYDROCHLOROTHIAZIDE 12.5 MG PO TABS: 12.5000 mg | ORAL_TABLET | Freq: Every day | ORAL | 1 refills | Status: DC | PRN

## 2019-09-20 NOTE — Patient Instructions (Addendum)
Sugar still looks great, 6.3.  Will have cholesterol results back next week.  Unsure how much of a drop to expect since you've only been on the med x1 month (typically takes 3 months for moderate change in cholesterol) but with exercise, may be better than we expect.

## 2019-09-20 NOTE — Progress Notes (Signed)
Subjective: CC: DM2, hyperlipidemia PCP: Janora Norlander, DO BMW:[id number] Suzanne Jimenez is a 59 y.o. female presenting to clinic today for:  1. Type 2 Diabetes w/ HLD:  Patient reports compliance with metformin 500 mg twice daily.  She just started the Lipitor 1 month ago.  Initially she did have some nausea with it but that seems to be getting better.  She has also noted a little bit more congestion and she was wondering if this was related to Lipitor.  She is been vaccinated against COVID and has had no other symptoms.  She notes the symptoms do seem to be worse after large meals at nighttime.  Last eye exam: Sees Dr. Marin Comment.  Had done. Last foot exam: UTD Last A1c:  Lab Results  Component Value Date   HGBA1C 6.3 03/18/2019   Nephropathy screen indicated?:  needs Last flu, zoster and/or pneumovax: Up-to-date Immunization History  Administered Date(s) Administered  . Influenza, Quadrivalent, Recombinant, Inj, Pf 11/12/2018  . Influenza,inj,Quad PF,6+ Mos 10/17/2012, 10/09/2013, 10/14/2014, 10/24/2016  . Influenza,inj,quad, With Preservative 11/02/2017  . Influenza-Unspecified 10/26/1992, 10/25/1996, 12/13/1999, 11/20/2003, 10/13/2004, 11/07/2005, 11/10/2006, 10/01/2007, 10/23/2008, 10/27/2009, 11/02/2011, 10/17/2012, 10/09/2013, 10/14/2014  . Pneumococcal Polysaccharide-23 02/09/2018  . Tdap 07/29/2009    ROS: Denies polyuria, polydipsia, unintended weight loss/gain.  No sensation changes.  No chest pain, shortness of breath.  2.  Seizure disorder Patient reports compliance with her carbamazepine.  No seizure activity.   ROS: Per HPI  Allergies  Allergen Reactions  . Dilantin [Phenytoin] Swelling    Swelling of lymph nodes  . Phenytoin Sodium Extended Rash   Past Medical History:  Diagnosis Date  . Abnormal Pap smear 1999  . Anemia   . Asthma   . BV (bacterial vaginosis) 05/07/2013  . Complication of anesthesia    Takes a while to wake up  . DM type 2 with diabetic  mixed hyperlipidemia ([place]) 11/13/2017  . Hyperlipidemia   . Seizures ([place]) 1990  . Vaginal discharge 05/07/2013  . Vaginal Pap smear, abnormal     Current Outpatient Medications:  .  atorvastatin (LIPITOR) 20 MG tablet, Take 1 tablet (20 mg total) by mouth daily., Disp: 90 tablet, Rfl: 3 .  blood glucose meter kit and supplies, Dispense based on patient and insurance preference. Use up to four times daily as directed. (FOR ICD-10 E10.9, E11.9)., Disp: 1 each, Rfl: 0 .  carbamazepine (TEGRETOL) 200 MG tablet, Take 1 tablet (200 mg total) by mouth 2 (two) times daily., Disp: 180 tablet, Rfl: 3 .  cetirizine (ZYRTEC) 10 MG tablet, Take 10 mg by mouth daily., Disp: , Rfl:  .  ferrous sulfate 325 (65 FE) MG tablet, Take 325 mg by mouth daily with breakfast., Disp: , Rfl:  .  fish oil-omega-3 fatty acids 1000 MG capsule, Take 2 g by mouth daily., Disp: , Rfl:  .  glucose blood test strip, Test twice daily,, Disp: 100 each, Rfl: 12 .  Lancet Device MISC, Test twice daily, Disp: 100 each, Rfl: 12 .  meclizine (ANTIVERT) 25 MG tablet, Take 1 tablet (25 mg total) by mouth 3 (three) times daily as needed for dizziness., Disp: 30 tablet, Rfl: 0 .  metFORMIN (GLUCOPHAGE) 500 MG tablet, TAKE 1 TABLET BY MOUTH TWICE DAILY WITH A MEAL, Disp: 180 tablet, Rfl: 1 .  Red Yeast Rice 600 MG CAPS, Take 600 mg by mouth daily., Disp: , Rfl:  Social History   Socioeconomic History  . Marital status: Married    Spouse name:  Not on file  . Number of children: 2  . Years of education: college4  . Highest education level: Not on file  Occupational History  . 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[place]  Tobacco Use  . Smoking status: Never Smoker  . Smokeless tobacco: Never Used  Vaping Use  . Vaping Use: Never used  Substance and Sexual Activity  . Alcohol use: No  . Drug use: No  . Sexual activity: Yes    Partners: Male    Birth control/protection: Surgical,  Post-menopausal    Comment: tubal  Other Topics Concern  . Not on file  Social History Narrative  . Not on file   Social Determinants of Health   Financial Resource Strain:   . Difficulty of Paying Living Expenses: Not on file  Food Insecurity:   . Worried About Charity fundraiser in the Last Year: Not on file  . Ran Out of Food in the Last Year: Not on file  Transportation Needs:   . Lack of Transportation (Medical): Not on file  . Lack of Transportation (Non-Medical): Not on file  Physical Activity:   . Days of Exercise per Week: Not on file  . Minutes of Exercise per Session: Not on file  Stress:   . Feeling of Stress : Not on file  Social Connections:   . Frequency of Communication with Friends and Family: Not on file  . Frequency of Social Gatherings with Friends and Family: Not on file  . Attends Religious Services: Not on file  . Active Member of Clubs or Organizations: Not on file  . Attends Archivist Meetings: Not on file  . Marital Status: Not on file  Intimate Partner Violence:   . Fear of Current or Ex-Partner: Not on file  . Emotionally Abused: Not on file  . Physically Abused: Not on file  . Sexually Abused: Not on file     Objective: Office vital signs reviewed. BP 115/79   Pulse 69   Temp 97.7 F (36.5 C) (Temporal)   Ht _0  (1.575 m)   Wt 183 lb 9.6 oz (83.3 kg)   LMP 12/29/2013   BMI 33.58 kg/m   Physical Examination:  General: Awake, alert, well nourished, No acute distress HEENT: Normal, sclera white, MMM Cardio: regular rate and rhythm, S1S2 heard, no murmurs appreciated Pulm: clear to auscultation bilaterally, no wheezes, rhonchi or rales; normal work of breathing on room air Extremities: warm, well perfused, No edema, cyanosis or clubbing; +2 pulses bilaterally  Assessment/ Plan: 59 y.o. female   1. Type 2 diabetes mellitus without complication, without long-term current use of insulin (HCC) A1c under excellent control.   Continue current regimen.  Check microalbumin - Microalbumin / creatinine urine ratio - Bayer DCA Hb A1c Waived  2. Hyperlipidemia associated with type 2 diabetes mellitus (HCC) Check lipid panel.  Unsure the impact of 1 months of Lipitor.  However, I am hopeful given the fact that she is also been exercising. - Lipid Panel - TSH  3. Vitamin D deficiency - VITAMIN D 25 Hydroxy (Vit-D Deficiency, Fractures)  4. Pedal edema As needed hydrochlorothiazide provided today. - hydrochlorothiazide (HYDRODIURIL) 12.5 MG tablet; Take 1 tablet (12.5 mg total) by mouth daily as needed (swelling).  Dispense: 90 tablet; Refill: 1  5. 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  She had a normal WBC and sodium level last visit.  We will plan to recheck again next visit  No orders of the defined types were placed in this encounter.  No orders of the defined types were placed in this encounter.    Janora Norlander, DO [place] [phone]

## 2019-09-21 LAB — LIPID PANEL
Chol/HDL Ratio: 2.7 ratio (ref 0.0–4.4)
Cholesterol, Total: 146 mg/dL (ref 100–199)
HDL: 54 mg/dL (ref >39)
LDL Chol Calc (NIH): 75 mg/dL (ref 0–99)
Triglycerides: 93 mg/dL (ref 0–149)
VLDL Cholesterol Cal: 17 mg/dL (ref 5–40)

## 2019-09-21 LAB — MICROALBUMIN / CREATININE URINE RATIO
Creatinine, Urine: 146.9 mg/dL
Microalb/Creat Ratio: 20 mg/g creat (ref 0–29)
Microalbumin, Urine: 29.3 ug/mL

## 2019-09-21 LAB — TSH: TSH: 2.73 u[IU]/mL (ref 0.450–4.500)

## 2019-09-21 LAB — VITAMIN D 25 HYDROXY (VIT D DEFICIENCY, FRACTURES): Vit D, 25-Hydroxy: 42.7 ng/mL (ref 30.0–100.0)

## 2019-09-23 ENCOUNTER — Other Ambulatory Visit: Payer: Self-pay | Admitting: Family Medicine

## 2019-11-06 ENCOUNTER — Ambulatory Visit (INDEPENDENT_AMBULATORY_CARE_PROVIDER_SITE_OTHER): Payer: Commercial Managed Care - PPO | Admitting: Family Medicine

## 2019-11-06 ENCOUNTER — Encounter: Payer: Self-pay | Admitting: Family Medicine

## 2019-11-06 ENCOUNTER — Other Ambulatory Visit: Payer: Self-pay

## 2019-11-06 DIAGNOSIS — G40109 Localization-related (focal) (partial) symptomatic epilepsy and epileptic syndromes with simple partial seizures, not intractable, without status epilepticus: Secondary | ICD-10-CM | POA: Diagnosis not present

## 2019-11-06 MED ORDER — CARBAMAZEPINE 200 MG PO TABS: 200.0000 mg | ORAL_TABLET | Freq: Two times a day (BID) | ORAL | 3 refills | Status: DC

## 2019-11-06 NOTE — Patient Instructions (Signed)
Below is our plan:  We will continue carbamazepine 200mg  twice daily.   Please make sure you are staying well hydrated. I recommend 50-60 ounces daily. Well balanced diet and regular exercise encouraged.   Please continue follow up with care team as directed.   Follow up with PCP if willing to refill carbamazepine, otherwise with annually.   You may receive a survey regarding today's visit. I encourage you to leave honest feed back as I do use this information to improve patient care. Thank you for seeing me today!     Carbamazepine tablets What is this medicine? CARBAMAZEPINE (kar ba MAZ e peen) is used to control seizures caused by certain types of epilepsy. This medicine is also used to treat nerve related pain. It is not for common aches and pains. This medicine may be used for other purposes; ask your health care provider or pharmacist if you have questions. COMMON BRAND NAME(S): Epitol, Tegretol What should I tell my health care provider before I take this medicine? They need to know if you have any of these conditions:  Asian ancestry  bone marrow disease  glaucoma  heart disease or irregular heartbeat  kidney disease  liver disease  low blood counts, like low white cell, platelet, or red cell counts  porphyria  psychotic disorders  suicidal thoughts, plans, or attempt; a previous suicide attempt by you or a family member  an unusual or allergic reaction to carbamazepine, tricyclic antidepressants, phenytoin, phenobarbital or other medicines, foods, dyes, or preservatives  pregnant or trying to get pregnant  breast-feeding How should I use this medicine? Take this medicine by mouth with a glass of water. Follow the directions on the prescription label. Take this medicine with food. Take your doses at regular intervals. Do not take your medicine more often than directed. Do not stop taking this medicine except on the advice of your doctor or health care  professional. A special MedGuide will be given to you by the pharmacist with each prescription and refill. Be sure to read this information carefully each time. Talk to your pediatrician regarding the use of this medicine in children. Special care may be needed. Overdosage: If you think you have taken too much of this medicine contact a poison control center or emergency room at once. NOTE: This medicine is only for you. Do not share this medicine with others. What if I miss a dose? If you miss a dose, take it as soon as you can. If it is almost time for your next dose, take only that dose. Do not take double or extra doses. What may interact with this medicine? Do not take this medicine with any of the following medications:  certain medicines used to treat HIV infection or AIDS that are given in combination with cobicistat   delavirdine   MAOIs like Carbex, Eldepryl, Marplan, Nardil, and Parnate   nefazodone   oxcarbazepine This medicine may also interact with the following medications:   acetaminophen   acetazolamide   barbiturate medicines for inducing sleep or treating seizures, like phenobarbital   certain antibiotics like clarithromycin, erythromycin or troleandomycin   cimetidine   cyclosporine   danazol   dicumarol   doxycycline   female hormones, including estrogens and birth control pills   grapefruit juice   isoniazid, INH   levothyroxine and other thyroid hormones   lithium and other medicines to treat mood problems or psychotic disturbances   loratadine   medicines for angina or high blood  pressure   medicines for cancer   medicines for depression or anxiety   medicines for sleep   medicines to treat fungal infections, like fluconazole, itraconazole or ketoconazole   medicines used to treat HIV infection or AIDS   methadone   niacinamide   praziquantel   propoxyphene   rifampin or rifabutin   seizure  or epilepsy medicine   steroid medicines such as prednisone or cortisone   theophylline   tramadol   warfarin This list may not describe all possible interactions. Give your health care provider a list of all the medicines, herbs, non-prescription drugs, or dietary supplements you use. Also tell them if you smoke, drink alcohol, or use illegal drugs. Some items may interact with your medicine. What should I watch for while using this medicine? Visit your doctor or health care provider for a regular check on your progress. Do not change brands or dosage forms of this medicine without discussing the change with your doctor or health care provider. If you are taking this medicine for epilepsy (seizures), do not stop taking it suddenly. This increases the risk of seizures. Wear a Arboriculturist or necklace. Carry an identification card with information about your condition, medications, and doctor or health care provider. This medicine may cause serious skin reactions. They can happen weeks to months after starting the medicine. Contact your health care provider right away if you notice fevers or flu-like symptoms with a rash. The rash may be red or purple and then turn into blisters or peeling of the skin. Or, you might notice a red rash with swelling of the face, lips or lymph nodes in your neck or under your arms. You may get drowsy, dizzy, or have blurred vision. Do not drive, use machinery, or do anything that needs mental alertness until you know how this medicine affects you. To reduce dizzy or fainting spells, do not sit or stand up quickly, especially if you are an older patient. Alcohol can increase drowsiness and dizziness. Avoid alcoholic drinks. Birth control pills may not work properly while you are taking this medicine. Talk to your doctor about using an extra method of birth control. This medicine can make you more sensitive to the sun. Keep out of the sun. If you cannot avoid  being in the sun, wear protective clothing and use sunscreen. Do not use sun lamps or tanning beds/booths. The use of this medicine may increase the chance of suicidal thoughts or actions. Pay special attention to how you are responding while on this medicine. Any worsening of mood, or thoughts of suicide or dying should be reported to your health care provider right away. Women who become pregnant while using this medicine may enroll in the [place] American Antiepileptic Drug Pregnancy Registry by calling [phone]. This registry collects information about the safety of antiepileptic drug use during pregnancy. This medicine may cause a decrease in vitamin D and folic acid. You should make sure that you get enough vitamins while you are taking this medicine. Discuss the foods you eat and the vitamins you take with your health care provider. What side effects may I notice from receiving this medicine? Side effects that you should report to your doctor or health care professional as soon as possible:  allergic reactions like skin rash, itching or hives, swelling of the face, lips, or tongue  breathing problems  changes in vision  confusion  dark urine  fast or irregular heartbeat  fever or chills, sore throat  mouth ulcers  pain or difficulty passing urine  rash, fever, and swollen lymph nodes  redness, blistering, peeling or loosening of the skin, including inside the mouth  ringing in the ears  seizures  stomach pain  swollen joints or muscle/joint aches and pains  unusual bleeding or bruising  unusually weak or tired  vomiting  worsening of mood, thoughts or actions of suicide or dying  yellowing of the eyes or skin Side effects that usually do not require medical attention (report to your doctor or health care professional if they continue or are bothersome):  clumsiness or unsteadiness  diarrhea or constipation  headache  increased sweating  nausea This  list may not describe all possible side effects. Call your doctor for medical advice about side effects. You may report side effects to FDA at 1-800-FDA-1088. Where should I keep my medicine? Keep out of reach of children. Store at room temperature below 30 degrees C (86 degrees F). Keep container tightly closed. Protect from moisture. Throw away any unused medicine after the expiration date. NOTE: This sheet is a summary. It may not cover all possible information. If you have questions about this medicine, talk to your doctor, pharmacist, or health care provider.  2020 Elsevier/Gold Standard (2018-03-29 09:05:49)   Seizure, Adult A seizure is a sudden burst of abnormal electrical activity in the brain. Seizures usually last from 30 seconds to 2 minutes. They can cause many different symptoms. Usually, seizures are not harmful unless they last a long time. What are the causes? Common causes of this condition include:  Fever or infection.  Conditions that affect the brain, such as: ? A brain abnormality that you were born with. ? A brain or head injury. ? Bleeding in the brain. ? A tumor. ? Stroke. ? Brain disorders such as autism or cerebral palsy.  Low blood sugar.  Conditions that are passed from parent to child (are inherited).  Problems with substances, such as: ? Having a reaction to a drug or a medicine. ? Suddenly stopping the use of a substance (withdrawal). In some cases, the cause may not be known. A person who has repeated seizures over time without a clear cause has a condition called epilepsy. What increases the risk? You are more likely to get this condition if you have:  A family history of epilepsy.  Had a seizure in the past.  A brain disorder.  A history of head injury, lack of oxygen at birth, or strokes. What are the signs or symptoms? There are many types of seizures. The symptoms vary depending on the type of seizure you have. Examples of symptoms  during a seizure include:  Shaking (convulsions).  Stiffness in the body.  Passing out (losing consciousness).  Head nodding.  Staring.  Not responding to sound or touch.  Loss of bladder control and bowel control. Some people have symptoms right before and right after a seizure happens. Symptoms before a seizure may include:  Fear.  Worry (anxiety).  Feeling like you may vomit (nauseous).  Feeling like the room is spinning (vertigo).  Feeling like you saw or heard something before (dj vu).  Odd tastes or smells.  Changes in how you see. You may see flashing lights or spots. Symptoms after a seizure happens can include:  Confusion.  Sleepiness.  Headache.  Weakness on one side of the body. How is this treated? Most seizures will stop on their own in under 5 minutes. In these cases, no treatment is needed.  Seizures that last longer than 5 minutes will usually need treatment. Treatment can include:  Medicines given through an IV tube.  Avoiding things that are known to cause your seizures. These can include medicines that you take for another condition.  Medicines to treat epilepsy.  Surgery to stop the seizures. This may be needed if medicines do not help. Follow these instructions at home: Medicines  Take over-the-counter and prescription medicines only as told by your doctor.  Do not eat or drink anything that may keep your medicine from working, such as alcohol. Activity  Do not do any activities that would be dangerous if you had another seizure, like driving or swimming. Wait until your doctor says it is safe for you to do them.  If you live in the [place] ask your local DMV (department of motor vehicles) when you can drive.  Get plenty of rest. Teaching others Teach friends and family what to do when you have a seizure. They should:  Lay you on the ground.  Protect your head and body.  Loosen any tight clothing around your neck.  Turn you  on your side.  Not hold you down.  Not put anything into your mouth.  Know whether or not you need emergency care.  Stay with you until you are better.  General instructions  Contact your doctor each time you have a seizure.  Avoid anything that gives you seizures.  Keep a seizure diary. Write down: ? What you think caused each seizure. ? What you remember about each seizure.  Keep all follow-up visits as told by your doctor. This is important. Contact a doctor if:  You have another seizure.  You have seizures more often.  There is any change in what happens during your seizures.  You keep having seizures with treatment.  You have symptoms of being sick or having an infection. Get help right away if:  You have a seizure that: ? Lasts longer than 5 minutes. ? Is different than seizures you had before. ? Makes it harder to breathe. ? Happens after you hurt your head.  You have any of these symptoms after a seizure: ? Not being able to speak. ? Not being able to use a part of your body. ? Confusion. ? A bad headache.  You have two or more seizures in a row.  You do not wake up right after a seizure.  You get hurt during a seizure. These symptoms may be an emergency. Do not wait to see if the symptoms will go away. Get medical help right away. Call your local emergency services (911 in the [place] Do not drive yourself to the hospital. Summary  Seizures usually last from 30 seconds to 2 minutes. Usually, they are not harmful unless they last a long time.  Do not eat or drink anything that may keep your medicine from working, such as alcohol.  Teach friends and family what to do when you have a seizure.  Contact your doctor each time you have a seizure. This information is not intended to replace advice given to you by your health care provider. Make sure you discuss any questions you have with your health care provider. Document Revised: 03/16/2018 Document  Reviewed: 03/16/2018 Elsevier Patient Education  2020 [employer]

## 2019-11-06 NOTE — Progress Notes (Signed)
Chief Complaint  Patient presents with  . Follow-up    rm 1  . Seizures    pt said she has had no new episodes.     HISTORY OF PRESENT ILLNESS: Today 11/06/19  Suzanne Jimenez is a 59 y.o. female here today for follow up for seizures. She had two separate seizures the year following surgical removal of AVM. She continues carbamazepine 245m BID. Last seizure greater than 25 years ago. Labs are followed by PCP. She is doing well today. She has retired. She is managing her household and driving without difficulty.    HISTORY (copied from Dr Dohmeier's note on 11/05/2018)  Rv : 11-05-2018, patient is a 59year old seizures patient wih good control of seizures after surgery. 25 years seizure free on Tegretol.  She has a WBC and sodium levels once a year with dr GLajuana Ripple DO and she has no need for tegretol levels as a well controlled patient.   Suzanne Jimenez, 89-year-old female returns today for yearly followup. 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 She had surgical removal of the AV malformation on the left and seizures have been under good control since that time. She is currently on Tegretol 2055mtwice daily. No seizure activity in 22 years .She denies any side effects to the medication, no daytime drowsiness, no feelings of being off balance. She denies missing any doses. She denies any headache. She has had no falls. Appetite is good, sleeping well. No new neurologic complaints. Recent CBZ level was 6.6 on 05/19/17.    She returns for reevaluation.  She has just retired.    REVIEW OF SYSTEMS: Out of a complete 14 system review of symptoms, the patient complains only of the following symptoms, seizures and all other reviewed systems are negative.   ALLERGIES: Allergies  Allergen Reactions  . Dilantin [Phenytoin] Swelling    Swelling of lymph nodes  . Phenytoin Sodium Extended Rash     HOME MEDICATIONS: Outpatient Medications Prior to Visit    Medication Sig Dispense Refill  . atorvastatin (LIPITOR) 20 MG tablet Take 1 tablet (20 mg total) by mouth daily. 90 tablet 3  . blood glucose meter kit and supplies Dispense based on patient and insurance preference. Use up to four times daily as directed. (FOR ICD-10 E10.9, E11.9). 1 each 0  . cetirizine (ZYRTEC) 10 MG tablet Take 10 mg by mouth daily.    . fish oil-omega-3 fatty acids 1000 MG capsule Take 2 g by mouth daily.    . Marland Kitchenlucose blood test strip Test twice daily, 100 each 12  . hydrochlorothiazide (HYDRODIURIL) 12.5 MG tablet Take 1 tablet (12.5 mg total) by mouth daily as needed (swelling). 90 tablet 1  . Lancet Device MISC Test twice daily 100 each 12  . metFORMIN (GLUCOPHAGE) 500 MG tablet TAKE 1 TABLET BY MOUTH TWICE DAILY WITH A MEAL 180 tablet 0  . Multiple Vitamins-Minerals (WOMENS MULTI VITAMIN & MINERAL PO) Take by mouth.    . Red Yeast Rice 600 MG CAPS Take 600 mg by mouth daily.    . carbamazepine (TEGRETOL) 200 MG tablet Take 1 tablet (200 mg total) by mouth 2 (two) times daily. 180 tablet 3  . ferrous sulfate 325 (65 FE) MG tablet Take 325 mg by mouth daily with breakfast.    . meclizine (ANTIVERT) 25 MG tablet Take 1 tablet (25 mg total) by mouth 3 (three) times daily as needed for dizziness. (Patient not taking: Reported  on 09/20/2019) 30 tablet 0   No facility-administered medications prior to visit.     PAST MEDICAL HISTORY: Past Medical History:  Diagnosis Date  . Abnormal Pap smear 1999  . Anemia   . Asthma   . BV (bacterial vaginosis) 05/07/2013  . Complication of anesthesia    Takes a while to wake up  . 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 Hyperlipidemia   . Seizures ([place]) 1990  . Vaginal discharge 05/07/2013  . Vaginal Pap smear, abnormal      PAST SURGICAL HISTORY: Past Surgical History:  Procedure Laterality Date  . BRAIN SURGERY  1990  . CESAREAN SECTION  2004  . COLONOSCOPY N/A 03/13/2013   Procedure:  COLONOSCOPY;  Surgeon: Rogene Houston, MD;  Location: [facility];  Service: Endoscopy;  Laterality: N/A;  830  . GYNECOLOGIC CRYOSURGERY  1999   Abnormal Pap  . TUBAL LIGATION       FAMILY HISTORY: Family History  Problem Relation Age of Onset  . Diabetes Mother   . Hypertension Mother   . Diabetes Father   . Heart disease Father   . Hypertension Father   . Heart disease Brother   . Diabetes Maternal Aunt   . Hypertension Maternal Aunt   . Diabetes Maternal Uncle   . Hypertension Maternal Uncle   . Colon cancer Neg Hx      SOCIAL HISTORY: Social History   Socioeconomic History  . Marital status: Married    Spouse name: Not on file  . Number of children: 2  . Years of education: college4  . Highest education level: Not on file  Occupational History  . 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 Smoking status: Never Smoker  . Smokeless tobacco: Never Used  Vaping Use  . Vaping Use: Never used  Substance and Sexual Activity  . Alcohol use: No  . Drug use: No  . Sexual activity: Yes    Partners: Male    Birth control/protection: Surgical, Post-menopausal    Comment: tubal  Other Topics Concern  . Not on file  Social History Narrative  . Not on file   Social Determinants of Health   Financial Resource Strain:   . Difficulty of Paying Living Expenses: Not on file  Food Insecurity:   . Worried About Charity fundraiser in the Last Year: Not on file  . Ran Out of Food in the Last Year: Not on file  Transportation Needs:   . Lack of Transportation (Medical): Not on file  . Lack of Transportation (Non-Medical): Not on file  Physical Activity:   . Days of Exercise per Week: Not on file  . Minutes of Exercise per Session: Not on file  Stress:   . Feeling of Stress : Not on file  Social Connections:   . Frequency of Communication with Friends and Family: Not on file  . Frequency of Social  Gatherings with Friends and Family: Not on file  . Attends Religious Services: Not on file  . Active Member of Clubs or Organizations: Not on file  . Attends Archivist Meetings: Not on file  . Marital Status: Not on file  Intimate Partner Violence:   . Fear of Current or Ex-Partner: Not on file  . Emotionally Abused: Not on file  . Physically Abused: Not on file  . Sexually Abused: Not on file  PHYSICAL EXAM  Vitals:   11/06/19 1026  BP: 116/76  Pulse: 78  Weight: 188 lb (85.3 kg)  Height: '5\' 1"'  (1.549 m)   Body mass index is 35.52 kg/m.   Generalized: Well developed, in no acute distress   Neurological examination  Mentation: Alert oriented to time, place, history taking. Follows all commands speech and language fluent Cranial nerve II-XII: Pupils were equal round reactive to light. Extraocular movements were full, visual field were full on confrontational test. Facial sensation and strength were normal. Head turning and shoulder shrug  were normal and symmetric. Motor: The motor testing reveals 5 over 5 strength of all 4 extremities. Good symmetric motor tone is noted throughout.  Sensory: Sensory testing is intact to soft touch on all 4 extremities. No evidence of extinction is noted.  Coordination: Cerebellar testing reveals good finger-nose-finger and heel-to-shin bilaterally.  Gait and station: Gait is normal. Tandem gait is normal. Romberg is negative. No drift is seen.  Reflexes: Deep tendon reflexes are symmetric and normal bilaterally.     DIAGNOSTIC DATA (LABS, IMAGING, TESTING) - I reviewed patient records, labs, notes, testing and imaging myself where available.  Lab Results  Component Value Date   WBC 7.1 03/18/2019   HGB 11.4 03/18/2019   HCT 34.7 03/18/2019   MCV 85 03/18/2019   PLT 297 03/18/2019      Component Value Date/Time   NA 142 03/18/2019 1511   K 4.3 03/18/2019 1511   CL 104 03/18/2019 1511   CO2 24 03/18/2019 1511    GLUCOSE 86 03/18/2019 1511   GLUCOSE 120 (H) 02/25/2018 1913   BUN 13 03/18/2019 1511   CREATININE 0.76 03/18/2019 1511   CALCIUM 9.7 03/18/2019 1511   PROT 7.1 03/18/2019 1511   ALBUMIN 4.6 03/18/2019 1511   AST 12 03/18/2019 1511   ALT 15 03/18/2019 1511   ALKPHOS 100 03/18/2019 1511   BILITOT <0.2 03/18/2019 1511   GFRNONAA 87 03/18/2019 1511   GFRAA 101 03/18/2019 1511   Lab Results  Component Value Date   CHOL 146 09/20/2019   HDL 54 09/20/2019   LDLCALC 75 09/20/2019   LDLDIRECT 154 (H) 03/18/2019   TRIG 93 09/20/2019   CHOLHDL 2.7 09/20/2019   Lab Results  Component Value Date   HGBA1C 6.3 09/20/2019   Lab Results  Component Value Date   VITAMINB12 443 06/22/2012   Lab Results  Component Value Date   TSH 2.730 09/20/2019      ASSESSMENT AND PLAN  59 y.o. year old female  has a past medical history of Abnormal Pap smear (1999), Anemia, Asthma, BV (bacterial vaginosis) (0/09/2328), Complication of anesthesia, DM type 2 with diabetic mixed hyperlipidemia ([place]) (11/13/2017), Hyperlipidemia, Seizures ([place]) (1990), Vaginal discharge (05/07/2013), and Vaginal Pap smear, abnormal. 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  Seizures have been well managed for over 25 years.  We will continue carbamazepine 200 mg twice daily.  Labs are followed by annually with PCP.  No obvious adverse effects of medication.  She was encouraged to stay well-hydrated.  Adequate sleep hygiene and well-balanced diet encouraged.  Regular exercise advised.  She may follow-up with PCP if willing to refill medications, otherwise follow-up with [place] annually.  She verbalizes understanding and agreement with this plan.  I spent 20 minutes of face-to-face and non-face-to-face time with patient.  This included previsit chart review, lab review,  study review, order entry, electronic  health record documentation, patient education.    Suzanne Presto, MSN, FNP-C 11/06/2019, 10:46 AM  [facility] [address] [place], [place] [address] [phone]

## 2020-02-29 ENCOUNTER — Other Ambulatory Visit: Payer: Self-pay | Admitting: Family Medicine

## 2020-03-20 ENCOUNTER — Encounter: Payer: Self-pay | Admitting: Family Medicine

## 2020-03-20 ENCOUNTER — Other Ambulatory Visit: Payer: Self-pay

## 2020-03-20 ENCOUNTER — Ambulatory Visit (INDEPENDENT_AMBULATORY_CARE_PROVIDER_SITE_OTHER): Payer: Commercial Managed Care - PPO | Admitting: Family Medicine

## 2020-03-20 VITALS — BP 159/80 | HR 76 | Temp 97.7°F | Ht 61.0 in | Wt 188.0 lb

## 2020-03-20 DIAGNOSIS — Z5181 Encounter for therapeutic drug level monitoring: Secondary | ICD-10-CM | POA: Diagnosis not present

## 2020-03-20 DIAGNOSIS — E785 Hyperlipidemia, unspecified: Secondary | ICD-10-CM

## 2020-03-20 DIAGNOSIS — E1169 Type 2 diabetes mellitus with other specified complication: Secondary | ICD-10-CM

## 2020-03-20 DIAGNOSIS — G40109 Localization-related (focal) (partial) symptomatic epilepsy and epileptic syndromes with simple partial seizures, not intractable, without status epilepticus: Secondary | ICD-10-CM

## 2020-03-20 DIAGNOSIS — F4321 Adjustment disorder with depressed mood: Secondary | ICD-10-CM

## 2020-03-20 LAB — BAYER DCA HB A1C WAIVED: HB A1C (BAYER DCA - WAIVED): 6.7 % (ref <7.0)

## 2020-03-20 LAB — CBC
Hematocrit: 36.3 % (ref 34.0–46.6)
MCH: 27.1 pg (ref 26.6–33.0)

## 2020-03-20 NOTE — Patient Instructions (Signed)
You had labs performed today.  You will be contacted with the results of the labs once they are available, usually in the next 3 business days for routine lab work.  If you have an active my chart account, they will be released to your MyChart.  If you prefer to have these labs released to you via telephone, please let us know.  If you had a pap smear or biopsy performed, expect to be contacted in about 7-10 days.  Please do not hesitate to reach out to me if you need me.  I am SO sorry about your father.

## 2020-03-20 NOTE — Progress Notes (Signed)
Subjective: CC: Dm PCP: Suzanne Norlander, DO OIZ:[id number] Z Reinecke is a 60 y.o. female presenting to clinic today for:  1. Type 2 Diabetes with hyperlipidemia:  Compliant with medications.  No chest pain, shortness of breath.  She does occasionally have pedal edema and uses hydrochlorothiazide for this.  Sometimes she notices that she has to pee too frequently to use it on the day of exercise and therefore she will skip it.  Last eye exam: Due next month Last foot exam: Needs Last A1c:  Lab Results  Component Value Date   HGBA1C 6.3 09/20/2019   Nephropathy screen indicated?:  Up-to-date Last flu, zoster and/or pneumovax:  Immunization History  Administered Date(s) Administered  . Influenza, Quadrivalent, Recombinant, Inj, Pf 11/12/2018  . Influenza,inj,Quad PF,6+ Mos 10/17/2012, 10/09/2013, 10/14/2014, 10/24/2016  . Influenza,inj,quad, With Preservative 11/02/2017  . Influenza-Unspecified 10/26/1992, 10/25/1996, 12/13/1999, 11/20/2003, 10/13/2004, 11/07/2005, 11/10/2006, 10/01/2007, 10/23/2008, 10/27/2009, 11/02/2011, 10/17/2012, 10/09/2013, 10/14/2014  . Moderna Sars-Covid-2 Vaccination 03/21/2019, 04/20/2019  . Pneumococcal Polysaccharide-23 02/09/2018  . Tdap 07/29/2009    2.  Seizure disorder Patient has been seizure-free since her last visit.  She continues to take Tegretol.  No apparent complications  3.  Grieving She lost her father in December to complications of urinary tract infection.  She feels well supported by her family.  Declines any services  ROS: Per HPI  Allergies  Allergen Reactions  . Dilantin [Phenytoin] Swelling    Swelling of lymph nodes  . Phenytoin Sodium Extended Rash   Past Medical History:  Diagnosis Date  . Abnormal Pap smear 1999  . Anemia   . Asthma   . BV (bacterial vaginosis) 05/07/2013  . Complication of anesthesia    Takes a while to wake up  . 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  Hyperlipidemia   . Seizures ([place]) 1990  . Vaginal discharge 05/07/2013  . Vaginal Pap smear, abnormal     Current Outpatient Medications:  .  atorvastatin (LIPITOR) 20 MG tablet, Take 1 tablet (20 mg total) by mouth daily., Disp: 90 tablet, Rfl: 3 .  blood glucose meter kit and supplies, Dispense based on patient and insurance preference. Use up to four times daily as directed. (FOR ICD-10 E10.9, E11.9)., Disp: 1 each, Rfl: 0 .  carbamazepine (TEGRETOL) 200 MG tablet, Take 1 tablet (200 mg total) by mouth 2 (two) times daily., Disp: 180 tablet, Rfl: 3 .  cetirizine (ZYRTEC) 10 MG tablet, Take 10 mg by mouth daily., Disp: , Rfl:  .  fish oil-omega-3 fatty acids 1000 MG capsule, Take 2 g by mouth daily., Disp: , Rfl:  .  glucose blood test strip, Test twice daily,, Disp: 100 each, Rfl: 12 .  hydrochlorothiazide (HYDRODIURIL) 12.5 MG tablet, Take 1 tablet (12.5 mg total) by mouth daily as needed (swelling)., Disp: 90 tablet, Rfl: 1 .  Lancet Device MISC, Test twice daily, Disp: 100 each, Rfl: 12 .  metFORMIN (GLUCOPHAGE) 500 MG tablet, TAKE 1 TABLET BY MOUTH TWICE DAILY WITH A MEAL, Disp: 180 tablet, Rfl: 0 .  Multiple Vitamins-Minerals (WOMENS MULTI VITAMIN & MINERAL PO), Take by mouth., Disp: , Rfl:  .  Red Yeast Rice 600 MG CAPS, Take 600 mg by mouth daily., Disp: , Rfl:  Social History   Socioeconomic History  . Marital status: Married    Spouse name: Not on file  . Number of children: 2  . Years of education: college4  . Highest education level: Not on file  Occupational History  . 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 Smoking status: Never Smoker  . Smokeless tobacco: Never Used  Vaping Use  . Vaping Use: Never used  Substance and Sexual Activity  . Alcohol use: No  . Drug use: No  . Sexual activity: Yes    Partners: Male    Birth control/protection: Surgical, Post-menopausal    Comment: tubal  Other Topics  Concern  . Not on file  Social History Narrative  . Not on file   Social Determinants of Health   Financial Resource Strain: Not on file  Food Insecurity: Not on file  Transportation Needs: Not on file  Physical Activity: Not on file  Stress: Not on file  Social Connections: Not on file  Intimate Partner Violence: Not on file   Family History  Problem Relation Age of Onset  . Diabetes Mother   . Hypertension Mother   . Diabetes Father   . Heart disease Father   . Hypertension Father   . Heart disease Brother   . Diabetes Maternal Aunt   . Hypertension Maternal Aunt   . Diabetes Maternal Uncle   . Hypertension Maternal Uncle   . Colon cancer Neg Hx     Objective: Office vital signs reviewed. BP (!) 159/80   Pulse 76   Temp 97.7 F (36.5 C) (Temporal)   Ht 5' 1" (1.549 m)   Wt 188 lb (85.3 kg)   LMP 12/29/2013   SpO2 98%   BMI 35.52 kg/m   Physical Examination:  General: Awake, alert, well nourished, No acute distress HEENT: Normal, sclera white, MMM Cardio: regular rate and rhythm, S1S2 heard, no murmurs appreciated Pulm: clear to auscultation bilaterally, no wheezes, rhonchi or rales; normal work of breathing on room air Extremities: warm, well perfused, trace pedal edema, no cyanosis or clubbing; +2 pulses bilaterally MSK: Normal gait and station Psych: Intermittently tearful  Diabetic Foot Exam - Simple   Simple Foot Form Diabetic Foot exam was performed with the following findings: Yes 03/20/2020  9:54 AM  Visual Inspection No deformities, no ulcerations, no other skin breakdown bilaterally: Yes Sensation Testing Intact to touch and monofilament testing bilaterally: Yes Pulse Check Posterior Tibialis and Dorsalis pulse intact bilaterally: Yes Comments    Depression screen [facility] 2/9 03/20/2020 09/20/2019 03/18/2019  Decreased Interest 0 0 0  Down, Depressed, Hopeless 0 0 0  PHQ - 2 Score 0 0 0  Altered sleeping 0 - -  Tired, decreased energy 0 - -   Change in appetite 0 - -  Feeling bad or failure about yourself  0 - -  Trouble concentrating 0 - -  Moving slowly or fidgety/restless 0 - -  Suicidal thoughts 0 - -  PHQ-9 Score 0 - -  Difficult doing work/chores - - -     Assessment/ Plan: 60 y.o. 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  Continue current regimen.  Continue lifestyle modification.  Continue statin  Been seizure-free.  Check yearly  CBC and CMP  Offered counseling services.  Could consider starting antidepressant but symptoms seem to be situational when talking about the passing of her father  Plan for physical with Pap at next visit.  Orders Placed This Encounter  Procedures  . Bayer DCA Hb A1c Waived  . CMP14+EGFR    Standing Status:   Future    Number of Occurrences:   1    Standing Expiration Date:   03/20/2021  . CBC    Standing Status:   Future    Number of Occurrences:   1    Standing Expiration Date:   03/20/2021   No orders of the defined types were placed in this encounter.    Suzanne Norlander, DO [place] [phone]

## 2020-03-21 LAB — CMP14+EGFR
ALT: 17 IU/L (ref 0–32)
AST: 10 IU/L (ref 0–40)
Albumin/Globulin Ratio: 1.8 (ref 1.2–2.2)
Albumin: 4.6 g/dL (ref 3.8–4.9)
Alkaline Phosphatase: 109 IU/L (ref 44–121)
BUN/Creatinine Ratio: 16 (ref 9–23)
BUN: 12 mg/dL (ref 6–24)
Bilirubin Total: <0.2 mg/dL (ref 0.0–1.2)
CO2: 23 mmol/L (ref 20–29)
Calcium: 9.6 mg/dL (ref 8.7–10.2)
Chloride: 108 mmol/L — ABNORMAL HIGH (ref 96–106)
Creatinine, Ser: 0.75 mg/dL (ref 0.57–1.00)
Globulin, Total: 2.5 g/dL (ref 1.5–4.5)
Glucose: 106 mg/dL — ABNORMAL HIGH (ref 65–99)
Potassium: 3.9 mmol/L (ref 3.5–5.2)
Sodium: 146 mmol/L — ABNORMAL HIGH (ref 134–144)
Total Protein: 7.1 g/dL (ref 6.0–8.5)
eGFR: 92 mL/min/{1.73_m2} (ref >59)

## 2020-03-21 LAB — CBC
Hemoglobin: 11.6 g/dL (ref 11.1–15.9)
MCHC: 32 g/dL (ref 31.5–35.7)
MCV: 85 fL (ref 79–97)
Platelets: 297 10*3/uL (ref 150–450)
RBC: 4.28 x10E6/uL (ref 3.77–5.28)
RDW: 13.1 % (ref 11.7–15.4)
WBC: 5 10*3/uL (ref 3.4–10.8)

## 2020-05-30 ENCOUNTER — Other Ambulatory Visit: Payer: Self-pay | Admitting: Family Medicine

## 2020-05-30 DIAGNOSIS — E1169 Type 2 diabetes mellitus with other specified complication: Secondary | ICD-10-CM

## 2020-05-30 DIAGNOSIS — E785 Hyperlipidemia, unspecified: Secondary | ICD-10-CM

## 2020-09-21 ENCOUNTER — Encounter: Payer: Self-pay | Admitting: Family Medicine

## 2020-09-21 ENCOUNTER — Other Ambulatory Visit: Payer: Self-pay

## 2020-09-21 ENCOUNTER — Ambulatory Visit (INDEPENDENT_AMBULATORY_CARE_PROVIDER_SITE_OTHER): Payer: Commercial Managed Care - PPO | Admitting: Family Medicine

## 2020-09-21 VITALS — BP 125/79 | HR 97 | Temp 97.5°F | Ht 61.0 in | Wt 186.6 lb

## 2020-09-21 DIAGNOSIS — E1169 Type 2 diabetes mellitus with other specified complication: Secondary | ICD-10-CM

## 2020-09-21 DIAGNOSIS — E785 Hyperlipidemia, unspecified: Secondary | ICD-10-CM

## 2020-09-21 LAB — BAYER DCA HB A1C WAIVED: HB A1C (BAYER DCA - WAIVED): 6 % — ABNORMAL HIGH (ref 4.8–5.6)

## 2020-09-21 MED ORDER — RYBELSUS 3 MG PO TABS: 3.0000 mg | ORAL_TABLET | Freq: Every day | ORAL | 0 refills | Status: AC

## 2020-09-21 NOTE — Patient Instructions (Addendum)
Rybelsus 3mg  added today.  We discussed that you can come of Metformin if you want to as we increase this dose.  Diabetes Mellitus and Standards of Medical Care Living with and managing diabetes (diabetes mellitus) can be complicated. Your diabetes treatment may be managed by a team of health care providers, including: A physician who specializes in diabetes (endocrinologist). You might also have visits with a nurse practitioner or physician assistant. Nurses. A registered dietitian. A certified diabetes care and education specialist. An exercise specialist. A pharmacist. An eye doctor. A foot specialist (podiatrist). A dental care provider. A primary care provider. A mental health care provider. How to manage your diabetes You can do many things to successfully manage your diabetes. Your health care providers will follow guidelines to help you get the best quality of care. Here are general guidelines for your diabetes management plan. Your health care providers may give you more specific instructions. Physical exams When you are diagnosed with diabetes, and each year after that, your health care provider will ask about your medical and family history. You will have a physical exam, which may include: Measuring your height, weight, and body mass index (BMI). Checking your blood pressure. This will be done at every routine medical visit. Your target blood pressure may vary depending on your medical conditions, your age, and other factors. A thyroid exam. A skin exam. Screening for nerve damage (peripheral neuropathy). This may include checking the pulse in your legs and feet and the level of sensation in your hands and feet. A foot exam to inspect the structure and skin of your feet, including checking for cuts, bruises, redness, blisters, sores, or other problems. Screening for blood vessel (vascular) problems. This may include checking the pulse in your legs and feet and checking your  temperature. Blood tests Depending on your treatment plan and your personal needs, you may have the following tests: Hemoglobin A1C (HbA1C). This test provides information about blood sugar (glucose) control over the previous 2-3 months. It is used to adjust your treatment plan, if needed. This test will be done: At least 2 times a year, if you are meeting your treatment goals. 4 times a year, if you are not meeting your treatment goals or if your goals have changed. Lipid testing, including total cholesterol, LDL and HDL cholesterol, and triglyceride levels. The goal for LDL is less than 100 mg/dL (5.5 mmol/L). If you are at high risk for complications, the goal is less than 70 mg/dL (3.9 mmol/L). The goal for HDL is 40 mg/dL (2.2 mmol/L) or higher for men, and 50 mg/dL (2.8 mmol/L) or higher for women. An HDL cholesterol of 60 mg/dL (3.3 mmol/L) or higher gives some protection against heart disease. The goal for triglycerides is less than 150 mg/dL (8.3 mmol/L). Liver function tests. Kidney function tests. Thyroid function tests.  Dental and eye exams  Visit your dentist two times a year. If you have type 1 diabetes, your health care provider may recommend an eye exam within 5 years after you are diagnosed, and then once a year after your first exam. For children with type 1 diabetes, the health care provider may recommend an eye exam when your child is age 94 or older and has had diabetes for 3-5 years. After the first exam, your child should get an eye exam once a year. If you have type 2 diabetes, your health care provider may recommend an eye exam as soon as you are diagnosed, and then every 1-2  years after your first exam. Immunizations A yearly flu (influenza) vaccine is recommended annually for everyone 6 months or older. This is especially important if you have diabetes. The pneumonia (pneumococcal) vaccine is recommended for everyone 2 years or older who has diabetes. If you are age  34 or older, you may get the pneumonia vaccine as a series of two separate shots. The hepatitis B vaccine is recommended for adults shortly after being diagnosed with diabetes. Adults and children with diabetes should receive all other vaccines according to age-specific recommendations from the Centers for Disease Control and Prevention (CDC). Mental and emotional health Screening for symptoms of eating disorders, anxiety, and depression is recommended at the time of diagnosis and after as needed. If your screening shows that you have symptoms, you may need more evaluation. You may work with a mental health care provider. Follow these instructions at home: Treatment plan You will monitor your blood glucose levels and may give yourself insulin. Your treatment plan will be reviewed at every medical visit. You and your health care provider will discuss: How you are taking your medicines, including insulin. Any side effects you have. Your blood glucose level target goals. How often you monitor your blood glucose level. Lifestyle habits, such as activity level and tobacco, alcohol, and substance use. Education Your health care provider will assess how well you are monitoring your blood glucose levels and whether you are taking your insulin and medicines correctly. He or she may refer you to: A certified diabetes care and education specialist to manage your diabetes throughout your life, starting at diagnosis. A registered dietitian who can create and review your personal nutrition plan. An exercise specialist who can discuss your activity level and exercise plan. General instructions Take over-the-counter and prescription medicines only as told by your health care provider. Keep all follow-up visits. This is important. Where to find support There are many diabetes support networks, including: American Diabetes Association (ADA): diabetes.org Defeat Diabetes Foundation: defeatdiabetes.org Where to  find more information American Diabetes Association (ADA): www.diabetes.org Association of Diabetes Care & Education Specialists (ADCES): diabeteseducator.org International Diabetes Federation (IDF): http://hill.biz/ Summary Managing diabetes (diabetes mellitus) can be complicated. Your diabetes treatment may be managed by a team of health care providers. Your health care providers follow guidelines to help you get the best quality care. You should have physical exams, blood tests, blood pressure monitoring, immunizations, and screening tests regularly. Stay updated on how to manage your diabetes. Your health care providers may also give you more specific instructions based on your individual health. This information is not intended to replace advice given to you by your health care provider. Make sure you discuss any questions you have with your health care provider. Document Revised: 07/04/2019 Document Reviewed: 07/04/2019 Elsevier Patient Education  2022 [employer].

## 2020-09-21 NOTE — Progress Notes (Signed)
Subjective: CC: DM PCP: Janora Norlander, DO [id number] Suzanne Jimenez is a 60 y.o. female presenting to clinic today for:  1. 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  Discontinued Lipitor due to arthralgias that she started having after about a month of taking the medicine.  No chest pain, shortness of breath, edema.  Continues to have difficulty losing weight despite improved diet and exercise.  Last eye exam: needs Last foot exam: needs Last A1c:  Lab Results  Component Value Date   HGBA1C 6.7 03/20/2020   Nephropathy screen indicated?: needs Last flu, zoster and/or pneumovax:  Immunization History  Administered Date(s) Administered   Influenza, Quadrivalent, Recombinant, Inj, Pf 11/12/2018   Influenza,inj,Quad PF,6+ Mos 10/17/2012, 10/09/2013, 10/14/2014, 10/24/2016   Influenza,inj,quad, With Preservative 11/02/2017   Influenza-Unspecified 10/26/1992, 10/25/1996, 12/13/1999, 11/20/2003, 10/13/2004, 11/07/2005, 11/10/2006, 10/01/2007, 10/23/2008, 10/27/2009, 11/02/2011, 10/17/2012, 10/09/2013, 10/14/2014   Moderna Sars-Covid-2 Vaccination 03/21/2019, 04/20/2019   Pneumococcal Polysaccharide-23 02/09/2018   Tdap 07/29/2009    ROS: Per HPI  Allergies  Allergen Reactions   Dilantin [Phenytoin] Swelling    Swelling of lymph nodes   Phenytoin Sodium Extended Rash   Past Medical History:  Diagnosis Date   Abnormal Pap smear 1999   Anemia    Asthma    BV (bacterial vaginosis) 9/82/6415   Complication of anesthesia    Takes a while to wake up   DM type 2 with diabetic mixed hyperlipidemia ([place]) 11/13/2017   Hyperlipidemia    Seizures ([place]) 1990   Vaginal discharge 05/07/2013   Vaginal Pap smear, abnormal     Current Outpatient Medications:    atorvastatin (LIPITOR) 20 MG tablet, Take 1 tablet by mouth once daily, Disp: 90 tablet, Rfl: 0   blood glucose meter kit and  supplies, Dispense based on patient and insurance preference. Use up to four times daily as directed. (FOR ICD-10 E10.9, E11.9)., Disp: 1 each, Rfl: 0   carbamazepine (TEGRETOL) 200 MG tablet, Take 1 tablet (200 mg total) by mouth 2 (two) times daily., Disp: 180 tablet, Rfl: 3   cetirizine (ZYRTEC) 10 MG tablet, Take 10 mg by mouth daily., Disp: , Rfl:    fish oil-omega-3 fatty acids 1000 MG capsule, Take 2 g by mouth daily., Disp: , Rfl:    glucose blood test strip, Test twice daily,, Disp: 100 each, Rfl: 12   hydrochlorothiazide (HYDRODIURIL) 12.5 MG tablet, Take 1 tablet (12.5 mg total) by mouth daily as needed (swelling)., Disp: 90 tablet, Rfl: 1   Lancet Device MISC, Test twice daily, Disp: 100 each, Rfl: 12   metFORMIN (GLUCOPHAGE) 500 MG tablet, TAKE 1 TABLET BY MOUTH TWICE DAILY WITH A MEAL, Disp: 180 tablet, Rfl: 0   Multiple Vitamins-Minerals (WOMENS MULTI VITAMIN & MINERAL PO), Take by mouth., Disp: , Rfl:    Red Yeast Rice 600 MG CAPS, Take 600 mg by mouth daily., Disp: , Rfl:  Social History   Socioeconomic History   Marital status: Married    Spouse name: Not on file   Number of children: 2   Years of education: college4   Highest education level: Not on file  Occupational History   Occupation: Education officer, museum    Employer: [place] [employer]  Tobacco Use   Smoking status: Never   Smokeless tobacco: Never  Vaping Use   Vaping Use: Never used  Substance and Sexual Activity   Alcohol use: No   Drug  use: No   Sexual activity: Yes    Partners: Male    Birth control/protection: Surgical, Post-menopausal    Comment: tubal  Other Topics Concern   Not on file  Social History Narrative   Not on file   Social Determinants of Health   Financial Resource Strain: Not on file  Food Insecurity: Not on file  Transportation Needs: Not on file  Physical Activity: Not on file  Stress: Not on file  Social Connections: Not on file  Intimate Partner  Violence: Not on file   Family History  Problem Relation Age of Onset   Diabetes Mother    Hypertension Mother    Diabetes Father    Heart disease Father    Hypertension Father    Heart disease Brother    Diabetes Maternal Aunt    Hypertension Maternal Aunt    Diabetes Maternal Uncle    Hypertension Maternal Uncle    Colon cancer Neg Hx     Objective: Office vital signs reviewed. BP 125/79   Pulse 97   Temp (!) 97.5 F (36.4 C)   Ht _0  (1.549 m)   Wt 186 lb 9.6 oz (84.6 kg)   LMP 12/29/2013   SpO2 98%   BMI 35.26 kg/m   Physical Examination:  General: Awake, alert, well nourished, No acute distress HEENT: Normal, sclera white, MMM Cardio: regular rate and rhythm, S1S2 heard, no murmurs appreciated Pulm: clear to auscultation bilaterally, no wheezes, rhonchi or rales; normal work of breathing on room air  Assessment/ Plan: 60 y.o. female   Controlled type 2 diabetes mellitus with other specified complication, without long-term current use of insulin ([place]) - Plan: Bayer DCA Hb A1c Waived, Microalbumin / creatinine urine ratio, Semaglutide (RYBELSUS) 3 MG TABS  Hyperlipidemia associated with type 2 diabetes mellitus ([place]) - Plan: Lipid Panel  Morbid obesity ([place])  Sugars under excellent control but has not met her weight loss goals.  We talked a little bit about options.  We will start Rybelsus.  We considered Mounjaro but because she is on a narrow therapeutic I felt this to be higher risk.  We also discussed Ozempic and Trulicity but she would prefer something not injectable.  No apparent contraindications to GLP.  Discussed avoidance of carbonated beverages, encourage small meals and adequate hydration.  Urine microalbumin obtained.  Due for diabetic foot exam.  Due for diabetic eye exam  Fasting lipid panel obtained.  Anticipate pulsed dosing of Crestor   No orders of the defined types were placed in this encounter.  No orders of the defined types were placed  in this encounter.    Janora Norlander, DO [place] [phone]

## 2020-09-22 ENCOUNTER — Other Ambulatory Visit: Payer: Self-pay

## 2020-09-22 LAB — MICROALBUMIN / CREATININE URINE RATIO
Creatinine, Urine: 60.6 mg/dL
Microalb/Creat Ratio: 18 mg/g creat (ref 0–29)
Microalbumin, Urine: 10.7 ug/mL

## 2020-09-22 LAB — LIPID PANEL
Chol/HDL Ratio: 4 ratio (ref 0.0–4.4)
Cholesterol, Total: 209 mg/dL — ABNORMAL HIGH (ref 100–199)
HDL: 52 mg/dL (ref >39)
LDL Chol Calc (NIH): 137 mg/dL — ABNORMAL HIGH (ref 0–99)
Triglycerides: 110 mg/dL (ref 0–149)
VLDL Cholesterol Cal: 20 mg/dL (ref 5–40)

## 2020-09-23 NOTE — Progress Notes (Signed)
Pt returning call about labs, please call back [phone]

## 2020-10-19 ENCOUNTER — Other Ambulatory Visit: Payer: Self-pay | Admitting: Family Medicine

## 2020-10-23 ENCOUNTER — Ambulatory Visit: Payer: Commercial Managed Care - PPO | Admitting: Family Medicine

## 2021-03-17 ENCOUNTER — Other Ambulatory Visit: Payer: Self-pay | Admitting: Family Medicine

## 2021-03-26 ENCOUNTER — Other Ambulatory Visit: Payer: Self-pay | Admitting: *Deleted

## 2021-03-26 DIAGNOSIS — G40109 Localization-related (focal) (partial) symptomatic epilepsy and epileptic syndromes with simple partial seizures, not intractable, without status epilepticus: Secondary | ICD-10-CM

## 2021-03-26 MED ORDER — CARBAMAZEPINE 200 MG PO TABS: 200.0000 mg | ORAL_TABLET | Freq: Two times a day (BID) | ORAL | 3 refills | Status: DC

## 2021-03-29 ENCOUNTER — Encounter: Payer: Self-pay | Admitting: Family Medicine

## 2021-03-29 ENCOUNTER — Ambulatory Visit (INDEPENDENT_AMBULATORY_CARE_PROVIDER_SITE_OTHER): Payer: Commercial Managed Care - PPO | Admitting: Family Medicine

## 2021-03-29 VITALS — BP 123/79 | HR 82 | Temp 98.2°F | Ht 61.0 in | Wt 187.0 lb

## 2021-03-29 DIAGNOSIS — R600 Localized edema: Secondary | ICD-10-CM

## 2021-03-29 DIAGNOSIS — E785 Hyperlipidemia, unspecified: Secondary | ICD-10-CM

## 2021-03-29 DIAGNOSIS — G40109 Localization-related (focal) (partial) symptomatic epilepsy and epileptic syndromes with simple partial seizures, not intractable, without status epilepticus: Secondary | ICD-10-CM | POA: Diagnosis not present

## 2021-03-29 DIAGNOSIS — E1169 Type 2 diabetes mellitus with other specified complication: Secondary | ICD-10-CM

## 2021-03-29 LAB — BAYER DCA HB A1C WAIVED: HB A1C (BAYER DCA - WAIVED): 6.2 % — ABNORMAL HIGH (ref 4.8–5.6)

## 2021-03-29 MED ORDER — ROSUVASTATIN CALCIUM 10 MG PO TABS: 10.0000 mg | ORAL_TABLET | ORAL | 3 refills | Status: DC

## 2021-03-29 MED ORDER — HYDROCHLOROTHIAZIDE 12.5 MG PO TABS: 12.5000 mg | ORAL_TABLET | Freq: Every day | ORAL | 1 refills | Status: DC | PRN

## 2021-03-29 NOTE — Progress Notes (Signed)
? ?Subjective: ?CC: DM ?PCP: Suzanne Norlander, DO ?QIO:Suzanne Jimenez is a 61 y.o. female presenting to clinic today for: ? ?1. Type 2 Diabetes with hyperlipidemia:  ?She is compliant with the metformin.  She was given a sample of Rybelsus at last visit but after 3 doses she could not tolerate it so she discontinued that.  She is been trying to take her atorvastatin for cholesterol but it gives her severe joint pain.  Despite recommendations for Crestor during last lab draw this appears to have not been sent in.  She would like to start the alternative.  She is interested in weight loss.  She is exercising regularly and feels like she is cleaned up her diet but still has not successful with weight loss. ? ?Last eye exam: needs.  We will schedule ?Last foot exam: needs ?Last A1c:  ?Lab Results  ?Component Value Date  ? HGBA1C 6.0 (H) 09/21/2020  ? ?Nephropathy screen indicated?: UTD ?Last flu, zoster and/or pneumovax:  ?Immunization History  ?Administered Date(s) Administered  ? Influenza, Quadrivalent, Recombinant, Inj, Pf 11/12/2018  ? Influenza,inj,Quad PF,6+ Mos 10/17/2012, 10/09/2013, 10/14/2014, 10/24/2016  ? Influenza,inj,quad, With Preservative 11/02/2017  ? Influenza-Unspecified 10/26/1992, 10/25/1996, 12/13/1999, 11/20/2003, 10/13/2004, 11/07/2005, 11/10/2006, 10/01/2007, 10/23/2008, 10/27/2009, 11/02/2011, 10/17/2012, 10/09/2013, 10/14/2014  ? Moderna Sars-Covid-2 Vaccination 03/21/2019, 04/20/2019  ? Pneumococcal Polysaccharide-23 02/09/2018  ? Tdap 07/29/2009  ? ? ?ROS: No chest pain, shortness of breath.  No pitting edema appreciated ? ?2. Epilepsy ?Compliant with her Tegretol.  No reports of breakthrough seizure activity. ? ? ?ROS: Per HPI ? ?Allergies  ?Allergen Reactions  ? Dilantin [Phenytoin] Swelling  ?  Swelling of lymph nodes  ? Phenytoin Sodium Extended Rash  ? ?Past Medical History:  ?Diagnosis Date  ? Abnormal Pap smear 1999  ? Anemia   ? Asthma   ? BV (bacterial vaginosis)  05/07/2013  ? Complication of anesthesia   ? Takes a while to wake up  ? DM type 2 with diabetic mixed hyperlipidemia ([place]) 11/13/2017  ? Hyperlipidemia   ? Seizures ([place]) 1990  ? Vaginal discharge 05/07/2013  ? Vaginal Pap smear, abnormal   ? ? ?Current Outpatient Medications:  ?  blood glucose meter kit and supplies, Dispense based on patient and insurance preference. Use up to four times daily as directed. (FOR ICD-10 E10.9, E11.9)., Disp: 1 each, Rfl: 0 ?  carbamazepine (TEGRETOL) 200 MG tablet, Take 1 tablet (200 mg total) by mouth 2 (two) times daily., Disp: 180 tablet, Rfl: 3 ?  cetirizine (ZYRTEC) 10 MG tablet, Take 10 mg by mouth daily., Disp: , Rfl:  ?  fish oil-omega-3 fatty acids 1000 MG capsule, Take 2 g by mouth daily., Disp: , Rfl:  ?  glucose blood test strip, Test twice daily,, Disp: 100 each, Rfl: 12 ?  hydrochlorothiazide (HYDRODIURIL) 12.5 MG tablet, Take 1 tablet (12.5 mg total) by mouth daily as needed (swelling)., Disp: 90 tablet, Rfl: 1 ?  Lancet Device MISC, Test twice daily, Disp: 100 each, Rfl: 12 ?  metFORMIN (GLUCOPHAGE) 500 MG tablet, TAKE 1 TABLET BY MOUTH TWICE DAILY WITH A MEAL, Disp: 180 tablet, Rfl: 0 ?  Multiple Vitamins-Minerals (WOMENS MULTI VITAMIN & MINERAL PO), Take by mouth., Disp: , Rfl:  ?  Red Yeast Rice 600 MG CAPS, Take 600 mg by mouth daily., Disp: , Rfl:  ?Social History  ? ?Socioeconomic History  ? Marital status: Married  ?  Spouse name: Not on file  ? Number of children: 2  ?  Years of education: college4  ? Highest education level: Not on file  ?Occupational History  ? Occupation: Education officer, museum  ?  Employer: [employer]  ?  Comment: [employer]  ?Tobacco Use  ? Smoking status: Never  ? Smokeless tobacco: Never  ?Vaping Use  ? Vaping Use: Never used  ?Substance and Sexual Activity  ? Alcohol use: No  ? Drug use: No  ? Sexual activity: Yes  ?  Partners: Male  ?  Birth control/protection: Surgical, Post-menopausal  ?  Comment: tubal  ?Other Topics Concern  ?  Not on file  ?Social History Narrative  ? Not on file  ? ?Social Determinants of Health  ? ?Financial Resource Strain: Not on file  ?Food Insecurity: Not on file  ?Transportation Needs: Not on file  ?Physical Activity: Not on file  ?Stress: Not on file  ?Social Connections: Not on file  ?Intimate Partner Violence: Not on file  ? ?Family History  ?Problem Relation Age of Onset  ? Diabetes Mother   ? Hypertension Mother   ? Diabetes Father   ? Heart disease Father   ? Hypertension Father   ? Heart disease Brother   ? Diabetes Maternal Aunt   ? Hypertension Maternal Aunt   ? Diabetes Maternal Uncle   ? Hypertension Maternal Uncle   ? Colon cancer Neg Hx   ? ? ?Objective: ?Office vital signs reviewed. ?BP 123/79   Pulse 82   Temp 98.2 ?F (36.8 ?C)   Ht _0  (1.549 m)   Wt 187 lb (84.8 kg)   LMP 12/29/2013   SpO2 98%   BMI 35.33 kg/m?  ? ?Physical Examination:  ?General: Awake, alert, well nourished, No acute distress ?HEENT: There white.  Moist mucous membranes ?Cardio: regular rate and rhythm, S1S2 heard, no murmurs appreciated ?Pulm: clear to auscultation bilaterally, no wheezes, rhonchi or rales; normal work of breathing on room air ?Neuro: see DM foot ?Diabetic Foot Exam - Simple   ?Simple Foot Form ?Diabetic Foot exam was performed with the following findings: Yes 03/29/2021 10:04 AM  ?Visual Inspection ?No deformities, no ulcerations, no other skin breakdown bilaterally: Yes ?Sensation Testing ?Intact to touch and monofilament testing bilaterally: Yes ?Pulse Check ?Posterior Tibialis and Dorsalis pulse intact bilaterally: Yes ?Comments ?  ? ? ? ?Assessment/ Plan: ?61 y.o. female  ? ?Controlled type 2 diabetes mellitus with other specified complication, without long-term current use of insulin ([place]) - Plan: CMP14+EGFR, Bayer DCA Hb A1c Waived ? ?Hyperlipidemia associated with type 2 diabetes mellitus ([place]) - Plan: CMP14+EGFR, Lipid Panel, rosuvastatin (CRESTOR) 10 MG tablet ? ?Morbid obesity ([place]) - Plan:  CMP14+EGFR, Bayer DCA Hb A1c Waived ? 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 ?Pedal edema - Plan: hydrochlorothiazide (HYDRODIURIL) 12.5 MG tablet ? ?Sugar remains under excellent control with A1c at 6.2 today.  We discussed Ozempic.  Could trial Trulicity as an alternative and if neither are helpful Mounjaro.  Check renal function.  Get eye exam done.  We had diabetic foot exam performed today.  I am going to reach out to our clinical pharmacist to ensure that we will not have a problem with a GLP and her epilepsy medicine. ? ?Check lipid panel.  Rosuvastatin q. OD prescribed. ? ?Hopefully with the GLP will be able to get some weight off of her.  She has already started lifestyle modification efforts to promote healthy life and weight loss ? ?No orders of the defined types were placed  in this encounter. ? ?No orders of the defined types were placed in this encounter. ? ? ? ?Suzanne Norlander, DO ?[place] ?[phone] ? ?

## 2021-03-30 LAB — LIPID PANEL
Chol/HDL Ratio: 3.5 ratio (ref 0.0–4.4)
Cholesterol, Total: 173 mg/dL (ref 100–199)
HDL: 50 mg/dL (ref >39)
LDL Chol Calc (NIH): 103 mg/dL — ABNORMAL HIGH (ref 0–99)
Triglycerides: 112 mg/dL (ref 0–149)
VLDL Cholesterol Cal: 20 mg/dL (ref 5–40)

## 2021-03-30 LAB — CMP14+EGFR
ALT: 20 IU/L (ref 0–32)
AST: 19 IU/L (ref 0–40)
Albumin/Globulin Ratio: 1.8 (ref 1.2–2.2)
Albumin: 4.6 g/dL (ref 3.8–4.9)
Alkaline Phosphatase: 107 IU/L (ref 44–121)
BUN/Creatinine Ratio: 23 (ref 9–23)
BUN: 18 mg/dL (ref 6–24)
Bilirubin Total: <0.2 mg/dL (ref 0.0–1.2)
CO2: 22 mmol/L (ref 20–29)
Calcium: 9.8 mg/dL (ref 8.7–10.2)
Chloride: 106 mmol/L (ref 96–106)
Creatinine, Ser: 0.77 mg/dL (ref 0.57–1.00)
Globulin, Total: 2.5 g/dL (ref 1.5–4.5)
Glucose: 111 mg/dL — ABNORMAL HIGH (ref 70–99)
Potassium: 4.1 mmol/L (ref 3.5–5.2)
Sodium: 145 mmol/L — ABNORMAL HIGH (ref 134–144)
Total Protein: 7.1 g/dL (ref 6.0–8.5)
eGFR: 89 mL/min/{1.73_m2} (ref >59)

## 2021-03-30 LAB — CBC
Hematocrit: 36.5 % (ref 34.0–46.6)
Hemoglobin: 11.9 g/dL (ref 11.1–15.9)
MCH: 27.4 pg (ref 26.6–33.0)
MCHC: 32.6 g/dL (ref 31.5–35.7)
MCV: 84 fL (ref 79–97)
Platelets: 314 10*3/uL (ref 150–450)
RBC: 4.34 x10E6/uL (ref 3.77–5.28)
RDW: 13.1 % (ref 11.7–15.4)
WBC: 6.5 10*3/uL (ref 3.4–10.8)

## 2021-04-14 ENCOUNTER — Telehealth: Payer: Self-pay | Admitting: Family Medicine

## 2021-04-14 DIAGNOSIS — E1169 Type 2 diabetes mellitus with other specified complication: Secondary | ICD-10-CM

## 2021-04-14 NOTE — Telephone Encounter (Signed)
I've messaged Suzanne Jimenez again. ?

## 2021-04-20 MED ORDER — OZEMPIC (0.25 OR 0.5 MG/DOSE) 2 MG/1.5ML ~~LOC~~ SOPN: PEN_INJECTOR | SUBCUTANEOUS | 2 refills | Status: DC

## 2021-04-20 NOTE — Telephone Encounter (Signed)
Please inform pt, our clinical pharmacist has reviewed her chart and no apparent contraindications to use of Ozempic with her seizure meds.  I have sent in the shot.  Please make sure she has a follow up scheduled with me in the next 2-3 months. ?

## 2021-04-20 NOTE — Telephone Encounter (Signed)
na

## 2021-04-20 NOTE — Telephone Encounter (Signed)
I appologize for delay ? ?Re: tegretol & ozempic ? ?Concurrent use of SEMAGLUTIDE and DRUGS WITH A NARROW THERAPEUTIC INDEX may result in increased narrow therapeutic index (NTI) drug exposure. ? ?This is due to rybelsus mostly (being a tablet) ?Ozempic injection appears to be fine with tegretol ? ?Would still continue to monitor tegretol levels ? ? ?"Concomitant administration of semaglutide with a drug having NTI may result in increased NTI drug exposure. Consider increased clinical or laboratory monitoring for medications that have a narrow therapeutic index or that require clinical monitoring" mircomedex ?

## 2021-04-27 NOTE — Telephone Encounter (Signed)
Patient aware of Dr. Jeannett Senior message. Patient has picked up Ozempic and will start tomorrow.  I advised to call if their are any problems ?

## 2021-06-28 NOTE — Patient Instructions (Signed)
Our records indicate that you are due for your annual mammogram/breast imaging. While there is no way to prevent breast cancer, early detection provides the best opportunity for curing it. For women over the age of 40, the American Cancer Society recommends a yearly clinical breast exam and a yearly mammogram. These practices have saved thousands of lives. We need your help to ensure that you are receiving optimal medical care. Please call the imaging location that has done you previous mammograms. Please remember to list us as your primary care. This helps make sure we receive a report and can update your chart.  Below is the contact information for several local breast imaging centers. You may call the location that works best for you, and they will be happy to assistance in making you an appointment. You do not need an order for a regular screening mammogram. However, if you are having any problems or concerns with you breast area, please let your primary care provider know, and appropriate orders will be placed. Please let our office know if you have any questions or concerns. Or if you need information for another imaging center not on this list or outside of the area. We are commented to working with you on your health care journey.   The mobile unit/bus ([facility]) - they come twice a month to our location.  These appointments can be made through our office or by call The Breast Center  [facility]  [address] [place], [place] [address] Phone [phone]  [facility] Radiology Department  [address]  [place], [place] [address] [phone]  [facility])  [address] [place], [place] [address] [phone]  [facility] [place]  [address] [place] [address] [phone]  [facility]  [address], [address] [place] [address] [phone]  [facility] [place]  [address] [place], [place] [address] [phone]  [facility] [address] [place], [place] [address] [phone]  [facility] [address]  [address] [place], [place] [address] [phone]  [facility] [address] [place] [address] [phone]     

## 2021-06-29 ENCOUNTER — Encounter: Payer: Self-pay | Admitting: Family Medicine

## 2021-06-29 ENCOUNTER — Ambulatory Visit (INDEPENDENT_AMBULATORY_CARE_PROVIDER_SITE_OTHER): Payer: Commercial Managed Care - PPO | Admitting: Family Medicine

## 2021-06-29 VITALS — BP 118/79 | HR 84 | Temp 97.6°F | Ht 61.0 in | Wt 174.2 lb

## 2021-06-29 DIAGNOSIS — M67442 Ganglion, left hand: Secondary | ICD-10-CM | POA: Diagnosis not present

## 2021-06-29 DIAGNOSIS — E785 Hyperlipidemia, unspecified: Secondary | ICD-10-CM | POA: Diagnosis not present

## 2021-06-29 DIAGNOSIS — E1169 Type 2 diabetes mellitus with other specified complication: Secondary | ICD-10-CM | POA: Diagnosis not present

## 2021-06-29 LAB — BAYER DCA HB A1C WAIVED: HB A1C (BAYER DCA - WAIVED): 5.8 % — ABNORMAL HIGH (ref 4.8–5.6)

## 2021-06-29 MED ORDER — SEMAGLUTIDE (1 MG/DOSE) 4 MG/3ML ~~LOC~~ SOPN: 1.0000 mg | PEN_INJECTOR | SUBCUTANEOUS | 1 refills | Status: DC

## 2021-06-29 NOTE — Progress Notes (Signed)
Subjective: CC:Dm PCP: Janora Norlander, DO NOM:VEHMCNOB Suzanne Jimenez is a 61 y.o. female presenting to clinic today for:  1. Type 2 Diabetes with hyperlipidemia:  Compliant with Ozempic 1 mg weekly.  Using metformin 500 mg daily.  Only use hydrochlorothiazide if needed for swelling which is very rare.  Taking her Crestor 10 mg every other day.  Last eye exam: Had done at [facility] Last foot exam: Up-to-date Last A1c:  Lab Results  Component Value Date   HGBA1C 6.2 (H) 03/29/2021   Nephropathy screen indicated?:  Up-to-date Last flu, zoster and/or pneumovax:  Immunization History  Administered Date(s) Administered   Influenza, Quadrivalent, Recombinant, Inj, Pf 11/12/2018   Influenza,inj,Quad PF,6+ Mos 10/17/2012, 10/09/2013, 10/14/2014, 10/24/2016   Influenza,inj,quad, With Preservative 11/02/2017   Influenza-Unspecified 10/26/1992, 10/25/1996, 12/13/1999, 11/20/2003, 10/13/2004, 11/07/2005, 11/10/2006, 10/01/2007, 10/23/2008, 10/27/2009, 11/02/2011, 10/17/2012, 10/09/2013, 10/14/2014   Moderna Sars-Covid-2 Vaccination 03/21/2019, 04/20/2019   Pneumococcal Polysaccharide-23 02/09/2018   Tdap 07/29/2009    ROS: Denies dizziness, LOC, polyuria, polydipsia, unintended weight loss/gain, foot ulcerations, numbness or tingling in extremities, shortness of breath or chest pain.  2.  Finger lesion Patient reports a little knot on the left index finger.  Minimal tenderness.  No issues with sensory changes or decreased range of motion.  ROS: Per HPI  Allergies  Allergen Reactions   Dilantin [Phenytoin] Swelling    Swelling of lymph nodes   Phenytoin Sodium Extended Rash   Past Medical History:  Diagnosis Date   Abnormal Pap smear 1999   Anemia    Asthma    BV (bacterial vaginosis) 0/96/2836   Complication of anesthesia    Takes a while to wake up   DM type 2 with diabetic mixed hyperlipidemia ([place]) 11/13/2017   Hyperlipidemia    Seizures ([place]) 1990   Vaginal discharge  05/07/2013   Vaginal Pap smear, abnormal     Current Outpatient Medications:    blood glucose meter kit and supplies, Dispense based on patient and insurance preference. Use up to four times daily as directed. (FOR ICD-10 E10.9, E11.9)., Disp: 1 each, Rfl: 0   carbamazepine (TEGRETOL) 200 MG tablet, Take 1 tablet (200 mg total) by mouth 2 (two) times daily., Disp: 180 tablet, Rfl: 3   cetirizine (ZYRTEC) 10 MG tablet, Take 10 mg by mouth daily., Disp: , Rfl:    fish oil-omega-3 fatty acids 1000 MG capsule, Take 2 g by mouth daily., Disp: , Rfl:    glucose blood test strip, Test twice daily,, Disp: 100 each, Rfl: 12   hydrochlorothiazide (HYDRODIURIL) 12.5 MG tablet, Take 1 tablet (12.5 mg total) by mouth daily as needed (swelling)., Disp: 90 tablet, Rfl: 1   Lancet Device MISC, Test twice daily, Disp: 100 each, Rfl: 12   metFORMIN (GLUCOPHAGE) 500 MG tablet, TAKE 1 TABLET BY MOUTH TWICE DAILY WITH A MEAL, Disp: 180 tablet, Rfl: 0   Multiple Vitamins-Minerals (WOMENS MULTI VITAMIN & MINERAL PO), Take by mouth., Disp: , Rfl:    Red Yeast Rice 600 MG CAPS, Take 600 mg by mouth daily., Disp: , Rfl:    rosuvastatin (CRESTOR) 10 MG tablet, Take 1 tablet (10 mg total) by mouth every other day., Disp: 45 tablet, Rfl: 3   Semaglutide,0.25 or 0.5MG/DOS, (OZEMPIC, 0.25 OR 0.5 MG/DOSE,) 2 MG/1.5ML SOPN, Inject 0.25 mg into the skin once a week for 28 days, THEN 0.5 mg once a week., Disp: 1.5 mL, Rfl: 2 Social History   Socioeconomic History   Marital status: Married  Spouse name: Not on file   Number of children: 2   Years of education: college4   Highest education level: Not on file  Occupational History   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[place]: [employer]  Tobacco Use   Smoking status: Never   Smokeless tobacco: Never  Scientific laboratory technician Use: Never used  Substance and Sexual Activity   Alcohol use: No   Drug use: No   Sexual activity: Yes    Partners:  Male    Birth control/protection: Surgical, Post-menopausal    Comment: tubal  Other Topics Concern   Not on file  Social History Narrative   Not on file   Social Determinants of Health   Financial Resource Strain: Not on file  Food Insecurity: Not on file  Transportation Needs: Not on file  Physical Activity: Not on file  Stress: Not on file  Social Connections: Not on file  Intimate Partner Violence: Not on file   Family History  Problem Relation Age of Onset   Diabetes Mother    Hypertension Mother    Diabetes Father    Heart disease Father    Hypertension Father    Heart disease Brother    Diabetes Maternal Aunt    Hypertension Maternal Aunt    Diabetes Maternal Uncle    Hypertension Maternal Uncle    Colon cancer Neg Hx     Objective: Office vital signs reviewed. BP 118/79   Pulse 84   Temp 97.6 F (36.4 C)   Ht '5\' 1"'  (1.549 m)   Wt 174 lb 3.2 oz (79 kg)   LMP 12/29/2013   SpO2 97%   BMI 32.91 kg/m   Physical Examination:  General: Awake, alert, well nourished, No acute distress HEENT: Sclera white.  Moist mucous membranes Cardio: regular rate and rhythm, S1S2 heard, no murmurs appreciated Pulm: clear to auscultation bilaterally, no wheezes, rhonchi or rales; normal work of breathing on room air Extremities: warm, well perfused, No edema, cyanosis or clubbing; +2 pulses bilaterally MSK: small BB sized cyst noted along the lateral aspect of the second digit of the left hand  Assessment/ Plan: 61 y.o. female   Controlled type 2 diabetes mellitus with other specified complication, without long-term current use of insulin ([place]) - Plan: Bayer DCA Hb A1c Waived, Semaglutide, 1 MG/DOSE, 4 MG/3ML SOPN  Hyperlipidemia associated with type 2 diabetes mellitus ([place]) - Plan: Lipid Panel, Hepatic function panel  Morbid obesity ([place])  Ganglion cyst of finger of left hand  Remains under excellent control with A1c of 5.8 today.  Going to drive her Ozempic further  to 1 mg in efforts to continue to reduce cardiovascular risk and promote weight loss.  No hypoglycemic episodes but go ahead and discontinue metformin since we are advancing the Ozempic dose.  ROI completed for diabetic eye exam  Lipid panel, hepatic function panel collected today.  Continue statin  Lesion of concern is consistent with a ganglion cyst, differential diagnoses include endochondroma.  Follow-up in the next 4 months for interval checkup  No orders of the defined types were placed in this encounter.  No orders of the defined types were placed in this encounter.    Janora Norlander, DO [place] [phone]

## 2021-06-30 LAB — LIPID PANEL
Chol/HDL Ratio: 3.9 ratio (ref 0.0–4.4)
Cholesterol, Total: 183 mg/dL (ref 100–199)
HDL: 47 mg/dL (ref >39)
LDL Chol Calc (NIH): 116 mg/dL — ABNORMAL HIGH (ref 0–99)
Triglycerides: 113 mg/dL (ref 0–149)
VLDL Cholesterol Cal: 20 mg/dL (ref 5–40)

## 2021-06-30 LAB — HEPATIC FUNCTION PANEL
ALT: 15 IU/L (ref 0–32)
AST: 13 IU/L (ref 0–40)
Albumin: 4.6 g/dL (ref 3.8–4.9)
Alkaline Phosphatase: 114 IU/L (ref 44–121)
Bilirubin Total: <0.2 mg/dL (ref 0.0–1.2)
Bilirubin, Direct: <0.1 mg/dL (ref 0.00–0.40)
Total Protein: 7.2 g/dL (ref 6.0–8.5)

## 2021-10-29 ENCOUNTER — Encounter: Payer: Self-pay | Admitting: Family Medicine

## 2021-10-29 ENCOUNTER — Ambulatory Visit (INDEPENDENT_AMBULATORY_CARE_PROVIDER_SITE_OTHER): Payer: Commercial Managed Care - PPO | Admitting: Family Medicine

## 2021-10-29 VITALS — BP 107/75 | HR 84 | Temp 97.2°F | Ht 61.0 in | Wt 164.6 lb

## 2021-10-29 DIAGNOSIS — L298 Other pruritus: Secondary | ICD-10-CM | POA: Diagnosis not present

## 2021-10-29 DIAGNOSIS — E669 Obesity, unspecified: Secondary | ICD-10-CM

## 2021-10-29 DIAGNOSIS — E785 Hyperlipidemia, unspecified: Secondary | ICD-10-CM

## 2021-10-29 DIAGNOSIS — E1169 Type 2 diabetes mellitus with other specified complication: Secondary | ICD-10-CM

## 2021-10-29 DIAGNOSIS — G40109 Localization-related (focal) (partial) symptomatic epilepsy and epileptic syndromes with simple partial seizures, not intractable, without status epilepticus: Secondary | ICD-10-CM | POA: Diagnosis not present

## 2021-10-29 LAB — BAYER DCA HB A1C WAIVED: HB A1C (BAYER DCA - WAIVED): 6.2 % — ABNORMAL HIGH (ref 4.8–5.6)

## 2021-10-29 MED ORDER — ROSUVASTATIN CALCIUM 10 MG PO TABS: 10.0000 mg | ORAL_TABLET | ORAL | 3 refills | Status: DC

## 2021-10-29 MED ORDER — SEMAGLUTIDE (1 MG/DOSE) 4 MG/3ML ~~LOC~~ SOPN: 1.0000 mg | PEN_INJECTOR | SUBCUTANEOUS | 1 refills | Status: DC

## 2021-10-29 MED ORDER — CARBAMAZEPINE 200 MG PO TABS: 200.0000 mg | ORAL_TABLET | Freq: Two times a day (BID) | ORAL | 3 refills | Status: DC

## 2021-10-29 MED ORDER — CLOBETASOL PROPIONATE 0.05 % EX SOLN: 1.0000 | Freq: Two times a day (BID) | CUTANEOUS | 0 refills | Status: DC | PRN

## 2021-10-29 NOTE — Progress Notes (Signed)
Subjective: CC:DM PCP: Suzanne Norlander, DO Suzanne Jimenez is a 61 y.o. female presenting to clinic today for:  1. Type 2 Diabetes with hyperlipidemia associated with morbid obesity:  Compliant with Ozempic 1 mg injected every week, Crestor 10 mg.  Takes hydrochlorothiazide only as needed and has not really needed it much since she has been losing weight on the Ozempic.  Blood sugars remain between 80 and 90s.  Denies any hypoglycemic episodes.  Last eye exam: Dr Marin Comment; due in November Last foot exam: UTD Last A1c:  Lab Results  Component Value Date   HGBA1C 5.8 (H) 06/29/2021   Nephropathy screen indicated?: needs Last flu, zoster and/or pneumovax:  Immunization History  Administered Date(s) Administered   Influenza, Quadrivalent, Recombinant, Inj, Pf 11/12/2018   Influenza,inj,Quad PF,6+ Mos 10/17/2012, 10/09/2013, 10/14/2014, 10/24/2016   Influenza,inj,quad, With Preservative 11/02/2017   Influenza-Unspecified 10/26/1992, 10/25/1996, 12/13/1999, 11/20/2003, 10/13/2004, 11/07/2005, 11/10/2006, 10/01/2007, 10/23/2008, 10/27/2009, 11/02/2011, 10/17/2012, 10/09/2013, 10/14/2014   Moderna Sars-Covid-2 Vaccination 03/21/2019, 04/20/2019   Pneumococcal Polysaccharide-23 02/09/2018   Tdap 07/29/2009    ROS: Denies dizziness, LOC, polyuria, polydipsia, unintended weight loss/gain, foot ulcerations, numbness or tingling in extremities, shortness of breath or chest pain.  2.  Seizure disorder Patient reports compliance with Tegretol twice daily.  No seizure activity.  3. Scalp irritation Reports scalp irritation that has been present for the last couple of weeks.  She thinks that this may be a nervous issue because her daughter recently went to college at [employer] in [place].  It is localized to the nape of her neck/scalp.  It is itchy.  ROS: Per HPI  Allergies  Allergen Reactions   Dilantin [Phenytoin] Swelling    Swelling of lymph nodes   Phenytoin Sodium  Extended Rash   Past Medical History:  Diagnosis Date   Abnormal Pap smear 1999   Anemia    Asthma    BV (bacterial vaginosis) 6/80/8811   Complication of anesthesia    Takes a while to wake up   DM type 2 with diabetic mixed hyperlipidemia ([place]) 11/13/2017   Hyperlipidemia    Seizures ([place]) 1990   Vaginal discharge 05/07/2013   Vaginal Pap smear, abnormal     Current Outpatient Medications:    blood glucose meter kit and supplies, Dispense based on patient and insurance preference. Use up to four times daily as directed. (FOR ICD-10 E10.9, E11.9)., Disp: 1 each, Rfl: 0   carbamazepine (TEGRETOL) 200 MG tablet, Take 1 tablet (200 mg total) by mouth 2 (two) times daily., Disp: 180 tablet, Rfl: 3   cetirizine (ZYRTEC) 10 MG tablet, Take 10 mg by mouth daily., Disp: , Rfl:    fish oil-omega-3 fatty acids 1000 MG capsule, Take 2 g by mouth daily., Disp: , Rfl:    glucose blood test strip, Test twice daily,, Disp: 100 each, Rfl: 12   hydrochlorothiazide (HYDRODIURIL) 12.5 MG tablet, Take 1 tablet (12.5 mg total) by mouth daily as needed (swelling)., Disp: 90 tablet, Rfl: 1   Lancet Device MISC, Test twice daily, Disp: 100 each, Rfl: 12   Multiple Vitamins-Minerals (WOMENS MULTI VITAMIN & MINERAL PO), Take by mouth., Disp: , Rfl:    Red Yeast Rice 600 MG CAPS, Take 600 mg by mouth daily., Disp: , Rfl:    rosuvastatin (CRESTOR) 10 MG tablet, Take 1 tablet (10 mg total) by mouth every other day., Disp: 45 tablet, Rfl: 3   Semaglutide, 1 MG/DOSE, 4 MG/3ML SOPN, Inject 1 mg as directed once a  week., Disp: 9 mL, Rfl: 1 Social History   Socioeconomic History   Marital status: Married    Spouse name: Not on file   Number of children: 2   Years of education: college4   Highest education level: Not on file  Occupational History   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[place]: [employer]  Tobacco Use   Smoking status: Never   Smokeless tobacco: Never  Brewing technologist Use: Never used  Substance and Sexual Activity   Alcohol use: No   Drug use: No   Sexual activity: Yes    Partners: Male    Birth control/protection: Surgical, Post-menopausal    Comment: tubal  Other Topics Concern   Not on file  Social History Narrative   Not on file   Social Determinants of Health   Financial Resource Strain: Not on file  Food Insecurity: Not on file  Transportation Needs: Not on file  Physical Activity: Not on file  Stress: Not on file  Social Connections: Not on file  Intimate Partner Violence: Not on file   Family History  Problem Relation Age of Onset   Diabetes Mother    Hypertension Mother    Diabetes Father    Heart disease Father    Hypertension Father    Heart disease Brother    Diabetes Maternal Aunt    Hypertension Maternal Aunt    Diabetes Maternal Uncle    Hypertension Maternal Uncle    Colon cancer Neg Hx     Objective: Office vital signs reviewed. BP 107/75   Pulse 84   Temp (!) 97.2 F (36.2 C)   Ht '5\' 1"'  (1.549 m)   Wt 164 lb 9.6 oz (74.7 kg)   LMP 12/29/2013   SpO2 96%   BMI 31.10 kg/m   Physical Examination:  General: Awake, alert, well nourished, No acute distress HEENT: sclera white, MMM Cardio: regular rate and rhythm, S1S2 heard, no murmurs appreciated Pulm: clear to auscultation bilaterally, no wheezes, rhonchi or rales; normal work of breathing on room air Extremities: warm, well perfused, No edema, cyanosis or clubbing; +2 pulses bilaterally MSK: normla gait and station Skin: dry; inflamed, erythematous patch noted along the bottom of the scalp that is approximately half dollar in size.  No overt lesions appreciated  Assessment/ Plan: 61 y.o. female   Controlled type 2 diabetes mellitus with other specified complication, without long-term current use of insulin ([place]) - Plan: Microalbumin / creatinine urine ratio, Bayer DCA Hb A1c Waived, Semaglutide, 1 MG/DOSE, 4 MG/3ML SOPN  Hyperlipidemia  associated with type 2 diabetes mellitus (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  Urine microalbumin ordered.  Ozempic renewed.  Okay to extend injection out to every 10 days given control of blood sugar and current shortage of medication  Statin renewed.  Will be due for fasting lab at next visit.  Her BMI has actually dropped into the obese range now from the morbidly obese range.  She is responding to the GLP appropriately and reducing cardiovascular risk as well as blood sugar  For the scalp dermatitis I have given clobetasol to use twice daily as needed.  Has had no seizures.  Tegretol renewed.  Plan for CMP and CBC at next visit  Orders Placed This Encounter  Procedures   Microalbumin / creatinine urine ratio   Bayer DCA Hb A1c Waived   No orders of the defined types were placed in this encounter.    Suzanne Norlander, DO [place] [phone]

## 2021-10-30 LAB — MICROALBUMIN / CREATININE URINE RATIO
Creatinine, Urine: 127.3 mg/dL
Microalb/Creat Ratio: 11 mg/g creat (ref 0–29)
Microalbumin, Urine: 14.2 ug/mL

## 2022-03-30 ENCOUNTER — Ambulatory Visit: Payer: Commercial Managed Care - PPO | Admitting: Family Medicine

## 2022-03-30 ENCOUNTER — Encounter: Payer: Self-pay | Admitting: Family Medicine

## 2022-03-30 ENCOUNTER — Other Ambulatory Visit (HOSPITAL_COMMUNITY)
Admission: RE | Admit: 2022-03-30 | Discharge: 2022-03-30 | Disposition: A | Discharge status: Home / Self Care | Source: Ambulatory Visit | Attending: Family Medicine | Admitting: Family Medicine

## 2022-03-30 VITALS — BP 117/74 | HR 76 | Temp 98.7°F | Ht 61.0 in | Wt 177.0 lb

## 2022-03-30 DIAGNOSIS — Z124 Encounter for screening for malignant neoplasm of cervix: Secondary | ICD-10-CM | POA: Insufficient documentation

## 2022-03-30 DIAGNOSIS — E669 Obesity, unspecified: Secondary | ICD-10-CM

## 2022-03-30 DIAGNOSIS — Z0000 Encounter for general adult medical examination without abnormal findings: Secondary | ICD-10-CM

## 2022-03-30 DIAGNOSIS — G40109 Localization-related (focal) (partial) symptomatic epilepsy and epileptic syndromes with simple partial seizures, not intractable, without status epilepticus: Secondary | ICD-10-CM

## 2022-03-30 DIAGNOSIS — Z780 Asymptomatic menopausal state: Secondary | ICD-10-CM

## 2022-03-30 DIAGNOSIS — E1169 Type 2 diabetes mellitus with other specified complication: Secondary | ICD-10-CM

## 2022-03-30 DIAGNOSIS — R600 Localized edema: Secondary | ICD-10-CM

## 2022-03-30 DIAGNOSIS — Z23 Encounter for immunization: Secondary | ICD-10-CM

## 2022-03-30 DIAGNOSIS — E785 Hyperlipidemia, unspecified: Secondary | ICD-10-CM

## 2022-03-30 DIAGNOSIS — Z0001 Encounter for general adult medical examination with abnormal findings: Secondary | ICD-10-CM

## 2022-03-30 LAB — BAYER DCA HB A1C WAIVED: HB A1C (BAYER DCA - WAIVED): 6.2 % — ABNORMAL HIGH (ref 4.8–5.6)

## 2022-03-30 MED ORDER — GLUCOSE BLOOD VI STRP: ORAL_STRIP | 12 refills | Status: DC

## 2022-03-30 MED ORDER — HYDROCHLOROTHIAZIDE 12.5 MG PO TABS: 12.5000 mg | ORAL_TABLET | Freq: Every day | ORAL | 3 refills | Status: AC | PRN

## 2022-03-30 MED ORDER — SEMAGLUTIDE (1 MG/DOSE) 4 MG/3ML ~~LOC~~ SOPN: 1.0000 mg | PEN_INJECTOR | SUBCUTANEOUS | 3 refills | Status: DC

## 2022-03-30 MED ORDER — ROSUVASTATIN CALCIUM 10 MG PO TABS: 10.0000 mg | ORAL_TABLET | ORAL | 3 refills | Status: DC

## 2022-03-30 NOTE — Progress Notes (Signed)
Suzanne Jimenez is a 61 y.o. female presents to office today for annual physical exam examination.    Concerns today include: 1. Type 2 Diabetes with hypertension, hyperlipidemia:  Reports compliance with medications.  She only uses hydrochlorothiazide roughly every couple of weeks.  Taking Crestor every other day.  Unfortunately, she has had to cut back on her semaglutide because her pharmacy was not getting it in stock.  She has been utilizing only 0.5 mg subcutaneously for the last several months.  No hypo or hyperglycemic episodes reported.  Continues to go to the gym 2-3 times per week and eats a low-carb diet  Last eye exam: need Last foot exam: need Last A1c:  Lab Results  Component Value Date   HGBA1C 6.2 (H) 10/29/2021   Nephropathy screen indicated?: need Last flu, zoster and/or pneumovax:  Immunization History  Administered Date(s) Administered   Influenza, Quadrivalent, Recombinant, Inj, Pf 11/12/2018   Influenza,inj,Quad PF,6+ Mos 10/17/2012, 10/09/2013, 10/14/2014, 10/24/2016   Influenza,inj,quad, With Preservative 11/02/2017   Influenza-Unspecified 10/26/1992, 10/25/1996, 12/13/1999, 11/20/2003, 10/13/2004, 11/07/2005, 11/10/2006, 10/01/2007, 10/23/2008, 10/27/2009, 11/02/2011, 10/17/2012, 10/09/2013, 10/14/2014   Moderna Sars-Covid-2 Vaccination 03/21/2019, 04/20/2019   Pneumococcal Polysaccharide-23 02/09/2018   Tdap 07/29/2009    ROS: Denies dizziness, LOC, polyuria, polydipsia, unintended weight loss/gain, foot ulcerations, numbness or tingling in extremities, shortness of breath or chest pain.   Last eye exam: UTD Last dental exam: UTD Last colonoscopy: UTD Last mammogram: UTD Last pap smear: needs Refills needed today: all Immunizations needed: Shingles Immunization History  Administered Date(s) Administered   Influenza, Quadrivalent, Recombinant, Inj, Pf 11/12/2018   Influenza,inj,Quad PF,6+ Mos 10/17/2012, 10/09/2013, 10/14/2014, 10/24/2016    Influenza,inj,quad, With Preservative 11/02/2017   Influenza-Unspecified 10/26/1992, 10/25/1996, 12/13/1999, 11/20/2003, 10/13/2004, 11/07/2005, 11/10/2006, 10/01/2007, 10/23/2008, 10/27/2009, 11/02/2011, 10/17/2012, 10/09/2013, 10/14/2014   Moderna Sars-Covid-2 Vaccination 03/21/2019, 04/20/2019   Pneumococcal Polysaccharide-23 02/09/2018   Tdap 07/29/2009     Past Medical History:  Diagnosis Date   Abnormal Pap smear 1999   Anemia    Asthma    BV (bacterial vaginosis) 123456   Complication of anesthesia    Takes a while to wake up   DM type 2 with diabetic mixed hyperlipidemia ([place]) 11/13/2017   Hyperlipidemia    Seizures (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[employer]  Tobacco Use   Smoking status: Never   Smokeless tobacco: Never  Scientific laboratory technician Use: Never used  Substance and Sexual Activity   Alcohol use: No   Drug use: No   Sexual activity: Yes    Partners: Male    Birth control/protection: Surgical, Post-menopausal    Comment: tubal  Other Topics Concern   Not on file  Social History Narrative   Not on file   Social Determinants of Health   Financial Resource Strain: Not on file  Food Insecurity: Not on file  Transportation Needs: Not on file  Physical Activity: Not on file  Stress: Not on file  Social Connections: Not on file  Intimate Partner Violence: Not on file   Past Surgical History:  Procedure Laterality Date   BRAIN SURGERY  1990   CESAREAN SECTION  2004   COLONOSCOPY N/A 03/13/2013   Procedure: COLONOSCOPY;  Surgeon: Rogene Houston, MD;  Location: [facility];  Service: Endoscopy;  Laterality: N/A;  [place]   Abnormal Pap   TUBAL LIGATION     Family History  Problem Relation Age of Onset   Diabetes Mother    Hypertension Mother    Diabetes Father    Heart disease Father    Hypertension Father    Heart disease Brother    Diabetes Maternal Aunt    Hypertension Maternal Aunt    Diabetes Maternal Uncle    Hypertension Maternal Uncle    Colon cancer Neg Hx     Current Outpatient Medications:    blood glucose meter kit and supplies, Dispense based on patient and insurance preference. Use up to four times daily as directed. (FOR ICD-10 E10.9, E11.9)., Disp: 1 each, Rfl: 0   carbamazepine (TEGRETOL) 200 MG tablet, Take 1 tablet (200 mg total) by mouth 2 (two) times daily., Disp: 180 tablet, Rfl: 3   cetirizine (ZYRTEC) 10 MG tablet, Take 10 mg by mouth daily., Disp: , Rfl:    clobetasol (TEMOVATE) 0.05 % external solution, Apply 1 Application topically 2 (two) times daily as needed (itching/ rash on scalp)., Disp: 50 mL, Rfl: 0   fish oil-omega-3 fatty acids 1000 MG capsule, Take 2 g by mouth daily., Disp: , Rfl:    glucose blood test strip, Test twice daily,, Disp: 100 each, Rfl: 12   hydrochlorothiazide (HYDRODIURIL) 12.5 MG tablet, Take 1 tablet (12.5 mg total) by mouth daily as needed (swelling)., Disp: 90 tablet, Rfl: 1   Lancet Device MISC, Test twice daily, Disp: 100 each, Rfl: 12   Multiple Vitamins-Minerals (WOMENS MULTI VITAMIN & MINERAL PO), Take by mouth., Disp: , Rfl:    Red Yeast Rice 600 MG CAPS, Take 600 mg by mouth daily., Disp: , Rfl:    rosuvastatin (CRESTOR) 10 MG tablet, Take 1 tablet (10 mg total) by mouth every other day., Disp: 45 tablet, Rfl: 3   Semaglutide, 1 MG/DOSE, 4 MG/3ML SOPN, Inject 1 mg as directed once a week., Disp: 9 mL, Rfl: 1  Allergies  Allergen Reactions   Dilantin [Phenytoin] Swelling    Swelling of lymph nodes   Phenytoin Sodium Extended Rash     ROS: Review of Systems A comprehensive review of systems was  negative except for: Integument/breast: positive for skin lesion(s)    Physical exam BP 117/74   Pulse 76   Temp 98.7 F (37.1 C)   Ht 5\' 1"  (1.549 m)   Wt 177 lb (80.3 kg)   LMP 12/29/2013   SpO2 99%   BMI 33.44 kg/m  General appearance: alert, cooperative, appears stated age, and no distress Head: Normocephalic, without obvious abnormality, atraumatic Eyes: conjunctivae/corneas clear. PERRL, EOM's intact. Fundi benign. Ears: normal TM's and external ear canals both ears Nose: Nares normal. Septum midline. Mucosa normal. No drainage or sinus tenderness. Throat: lips, mucosa, and tongue normal; teeth and gums normal Neck: no adenopathy, supple, symmetrical, trachea midline, and thyroid not enlarged, symmetric, no tenderness/mass/nodules Back: symmetric, no curvature. ROM normal. No CVA tenderness. Lungs: clear to auscultation bilaterally Heart: regular rate and rhythm, S1, S2 normal, no murmur, click, rub or gallop Abdomen: soft, non-tender; bowel sounds normal; no masses,  no organomegaly Pelvic: cervix normal in appearance, external genitalia normal, no adnexal masses or tenderness, no cervical motion tenderness, rectovaginal septum  normal, uterus normal size, shape, and consistency, vagina normal without discharge, and Cervix is HIGH and to the patient's anatomic left Extremities: extremities normal, atraumatic, no cyanosis or edema Pulses: 2+ and symmetric Skin: Skin color, texture, turgor normal. No rashes.  Multiple seborrheic keratosis and pigmented lesions noted along bilateral sides of the face and neck Lymph nodes: Cervical, supraclavicular, and axillary nodes normal. Neurologic: Grossly normal Diabetic Foot Exam - Simple   Simple Foot Form Diabetic Foot exam was performed with the following findings: Yes 03/30/2022  9:35 AM  Visual Inspection No deformities, no ulcerations, no other skin breakdown bilaterally: Yes Sensation Testing Intact to touch and monofilament  testing bilaterally: Yes Pulse Check Posterior Tibialis and Dorsalis pulse intact bilaterally: Yes Comments        Assessment/ Plan: Suzanne Jimenez here for annual physical exam.   Annual physical exam  Need for shingles vaccine - Plan: Zoster Recombinant (Shingrix )  Screening for malignant neoplasm of cervix - Plan: Cytology - PAP  Controlled type 2 diabetes mellitus with other specified complication, without long-term current use of insulin ([place]) - Plan: Bayer DCA Hb A1c Waived, Microalbumin / creatinine urine ratio, Semaglutide, 1 MG/DOSE, 4 MG/3ML SOPN  Hyperlipidemia associated with type 2 diabetes mellitus ([place]) - Plan: TSH, Lipid Panel, rosuvastatin (CRESTOR) 10 MG tablet  Obesity (BMI 30.0-34.9) - Plan: VITAMIN D 25 Hydroxy (Vit-D Deficiency, Fractures)  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  Shingles vaccination administered.  DEXA scan ordered  Diabetes remains under excellent control despite reduction in Ozempic dosing.  Check urine microalbumin.  Diabetic foot exam performed  Fasting labs collected.  Continue statin every other day as tolerated  Asymptomatic from a seizure standpoint.  CMP and CBC collected.  Will CC to neurology  Counseled on healthy lifestyle choices, including diet (rich in fruits, vegetables and lean meats and low in salt and simple carbohydrates) and exercise (at least 30 minutes of moderate physical activity daily).  Patient to follow up in 1 year for annual exam or sooner if needed.  Suzanne Jimenez M. Lajuana Ripple, DO

## 2022-03-30 NOTE — Patient Instructions (Signed)
Preventive Care 40-61 Years Old, Female Preventive care refers to lifestyle choices and visits with your health care provider that can promote health and wellness. Preventive care visits are also called wellness exams. What can I expect for my preventive care visit? Counseling Your health care provider may ask you questions about your: Medical history, including: Past medical problems. Family medical history. Pregnancy history. Current health, including: Menstrual cycle. Method of birth control. Emotional well-being. Home life and relationship well-being. Sexual activity and sexual health. Lifestyle, including: Alcohol, nicotine or tobacco, and drug use. Access to firearms. Diet, exercise, and sleep habits. Work and work environment. Sunscreen use. Safety issues such as seatbelt and bike helmet use. Physical exam Your health care provider will check your: Height and weight. These may be used to calculate your BMI (body mass index). BMI is a measurement that tells if you are at a healthy weight. Waist circumference. This measures the distance around your waistline. This measurement also tells if you are at a healthy weight and may help predict your risk of certain diseases, such as type 2 diabetes and high blood pressure. Heart rate and blood pressure. Body temperature. Skin for abnormal spots. What immunizations do I need?  Vaccines are usually given at various ages, according to a schedule. Your health care provider will recommend vaccines for you based on your age, medical history, and lifestyle or other factors, such as travel or where you work. What tests do I need? Screening Your health care provider may recommend screening tests for certain conditions. This may include: Lipid and cholesterol levels. Diabetes screening. This is done by checking your blood sugar (glucose) after you have not eaten for a while (fasting). Pelvic exam and Pap test. Hepatitis B test. Hepatitis C  test. HIV (human immunodeficiency virus) test. STI (sexually transmitted infection) testing, if you are at risk. Lung cancer screening. Colorectal cancer screening. Mammogram. Talk with your health care provider about when you should start having regular mammograms. This may depend on whether you have a family history of breast cancer. BRCA-related cancer screening. This may be done if you have a family history of breast, ovarian, tubal, or peritoneal cancers. Bone density scan. This is done to screen for osteoporosis. Talk with your health care provider about your test results, treatment options, and if necessary, the need for more tests. Follow these instructions at home: Eating and drinking  Eat a diet that includes fresh fruits and vegetables, whole grains, lean protein, and low-fat dairy products. Take vitamin and mineral supplements as recommended by your health care provider. Do not drink alcohol if: Your health care provider tells you not to drink. You are pregnant, may be pregnant, or are planning to become pregnant. If you drink alcohol: Limit how much you have to 0-1 drink a day. Know how much alcohol is in your drink. In the [place] one drink equals one 12 oz bottle of beer (355 mL), one 5 oz glass of wine (148 mL), or one 1 oz glass of hard liquor (44 mL). Lifestyle Brush your teeth every morning and night with fluoride toothpaste. Floss one time each day. Exercise for at least 30 minutes 5 or more days each week. Do not use any products that contain nicotine or tobacco. These products include cigarettes, chewing tobacco, and vaping devices, such as e-cigarettes. If you need help quitting, ask your health care provider. Do not use drugs. If you are sexually active, practice safe sex. Use a condom or other form of protection to   prevent STIs. If you do not wish to become pregnant, use a form of birth control. If you plan to become pregnant, see your health care provider for a  prepregnancy visit. Take aspirin only as told by your health care provider. Make sure that you understand how much to take and what form to take. Work with your health care provider to find out whether it is safe and beneficial for you to take aspirin daily. Find healthy ways to manage stress, such as: Meditation, yoga, or listening to music. Journaling. Talking to a trusted person. Spending time with friends and family. Minimize exposure to UV radiation to reduce your risk of skin cancer. Safety Always wear your seat belt while driving or riding in a vehicle. Do not drive: If you have been drinking alcohol. Do not ride with someone who has been drinking. When you are tired or distracted. While texting. If you have been using any mind-altering substances or drugs. Wear a helmet and other protective equipment during sports activities. If you have firearms in your house, make sure you follow all gun safety procedures. Seek help if you have been physically or sexually abused. What's next? Visit your health care provider once a year for an annual wellness visit. Ask your health care provider how often you should have your eyes and teeth checked. Stay up to date on all vaccines. This information is not intended to replace advice given to you by your health care provider. Make sure you discuss any questions you have with your health care provider. Document Revised: 06/24/2020 Document Reviewed: 06/24/2020 Elsevier Patient Education  2023 Elsevier Inc.  

## 2022-03-31 LAB — CMP14+EGFR
ALT: 18 IU/L (ref 0–32)
AST: 13 IU/L (ref 0–40)
Albumin/Globulin Ratio: 1.8 (ref 1.2–2.2)
Albumin: 4.4 g/dL (ref 3.8–4.9)
Alkaline Phosphatase: 120 IU/L (ref 44–121)
BUN/Creatinine Ratio: 15 (ref 12–28)
BUN: 11 mg/dL (ref 8–27)
Bilirubin Total: <0.2 mg/dL (ref 0.0–1.2)
CO2: 23 mmol/L (ref 20–29)
Calcium: 9.6 mg/dL (ref 8.7–10.3)
Chloride: 106 mmol/L (ref 96–106)
Creatinine, Ser: 0.75 mg/dL (ref 0.57–1.00)
Globulin, Total: 2.5 g/dL (ref 1.5–4.5)
Glucose: 103 mg/dL — ABNORMAL HIGH (ref 70–99)
Potassium: 4.1 mmol/L (ref 3.5–5.2)
Sodium: 144 mmol/L (ref 134–144)
Total Protein: 6.9 g/dL (ref 6.0–8.5)
eGFR: 91 mL/min/{1.73_m2} (ref >59)

## 2022-03-31 LAB — CBC
Hematocrit: 36.2 % (ref 34.0–46.6)
Hemoglobin: 11.8 g/dL (ref 11.1–15.9)
MCH: 27.3 pg (ref 26.6–33.0)
MCHC: 32.6 g/dL (ref 31.5–35.7)
MCV: 84 fL (ref 79–97)
Platelets: 263 10*3/uL (ref 150–450)
RBC: 4.32 x10E6/uL (ref 3.77–5.28)
RDW: 13.6 % (ref 11.7–15.4)
WBC: 5 10*3/uL (ref 3.4–10.8)

## 2022-03-31 LAB — MICROALBUMIN / CREATININE URINE RATIO
Creatinine, Urine: 113.4 mg/dL
Microalb/Creat Ratio: 6 mg/g creat (ref 0–29)
Microalbumin, Urine: 6.6 ug/mL

## 2022-03-31 LAB — TSH: TSH: 2.05 u[IU]/mL (ref 0.450–4.500)

## 2022-03-31 LAB — LIPID PANEL
Chol/HDL Ratio: 3.6 ratio (ref 0.0–4.4)
Cholesterol, Total: 205 mg/dL — ABNORMAL HIGH (ref 100–199)
HDL: 57 mg/dL (ref >39)
LDL Chol Calc (NIH): 134 mg/dL — ABNORMAL HIGH (ref 0–99)
Triglycerides: 78 mg/dL (ref 0–149)
VLDL Cholesterol Cal: 14 mg/dL (ref 5–40)

## 2022-03-31 LAB — VITAMIN D 25 HYDROXY (VIT D DEFICIENCY, FRACTURES): Vit D, 25-Hydroxy: 25.6 ng/mL — ABNORMAL LOW (ref 30.0–100.0)

## 2022-04-01 ENCOUNTER — Other Ambulatory Visit: Payer: Self-pay | Admitting: Family Medicine

## 2022-04-01 DIAGNOSIS — E1169 Type 2 diabetes mellitus with other specified complication: Secondary | ICD-10-CM

## 2022-04-01 DIAGNOSIS — E559 Vitamin D deficiency, unspecified: Secondary | ICD-10-CM

## 2022-04-01 LAB — CYTOLOGY - PAP
Comment: NEGATIVE
Diagnosis: NEGATIVE
High risk HPV: NEGATIVE

## 2022-04-01 MED ORDER — ROSUVASTATIN CALCIUM 10 MG PO TABS: 10.0000 mg | ORAL_TABLET | Freq: Every day | ORAL | 3 refills | Status: DC

## 2022-04-01 MED ORDER — VITAMIN D (ERGOCALCIFEROL) 1.25 MG (50000 UNIT) PO CAPS: 50000.0000 [IU] | ORAL_CAPSULE | ORAL | 3 refills | Status: DC

## 2022-07-09 ENCOUNTER — Other Ambulatory Visit (HOSPITAL_COMMUNITY): Payer: Self-pay

## 2022-09-16 ENCOUNTER — Telehealth: Payer: Self-pay | Admitting: Family Medicine

## 2022-10-04 DIAGNOSIS — Z23 Encounter for immunization: Secondary | ICD-10-CM | POA: Diagnosis not present

## 2022-10-05 ENCOUNTER — Encounter: Payer: Self-pay | Admitting: Family Medicine

## 2022-10-05 ENCOUNTER — Ambulatory Visit (INDEPENDENT_AMBULATORY_CARE_PROVIDER_SITE_OTHER): Payer: Commercial Managed Care - PPO | Admitting: Family Medicine

## 2022-10-05 ENCOUNTER — Other Ambulatory Visit

## 2022-10-05 VITALS — BP 114/74 | HR 80 | Temp 98.6°F | Ht 61.0 in | Wt 186.0 lb

## 2022-10-05 DIAGNOSIS — E559 Vitamin D deficiency, unspecified: Secondary | ICD-10-CM | POA: Diagnosis not present

## 2022-10-05 DIAGNOSIS — K130 Diseases of lips: Secondary | ICD-10-CM

## 2022-10-05 DIAGNOSIS — L819 Disorder of pigmentation, unspecified: Secondary | ICD-10-CM

## 2022-10-05 DIAGNOSIS — E1169 Type 2 diabetes mellitus with other specified complication: Secondary | ICD-10-CM

## 2022-10-05 DIAGNOSIS — Z7985 Long-term (current) use of injectable non-insulin antidiabetic drugs: Secondary | ICD-10-CM | POA: Diagnosis not present

## 2022-10-05 DIAGNOSIS — E785 Hyperlipidemia, unspecified: Secondary | ICD-10-CM

## 2022-10-05 LAB — BAYER DCA HB A1C WAIVED: HB A1C (BAYER DCA - WAIVED): 6.4 % — ABNORMAL HIGH (ref 4.8–5.6)

## 2022-10-05 MED ORDER — SEMAGLUTIDE (2 MG/DOSE) 8 MG/3ML ~~LOC~~ SOPN: 2.0000 mg | PEN_INJECTOR | SUBCUTANEOUS | 3 refills | Status: DC

## 2022-10-05 NOTE — Progress Notes (Signed)
Subjective: CC:DM PCP: Raliegh Ip, DO ZOX:WRUEAVWU Z Suzanne Jimenez is a 62 y.o. female presenting to clinic today for:  1. Type 2 Diabetes with hypertension, hyperlipidemia:  Patient reports compliance with her semaglutide 1 mg weekly.  She is compliant with Crestor and HCTZ.  Diabetes Health Maintenance Due  Topic Date Due   OPHTHALMOLOGY EXAM  10/05/2022 (Originally 12/26/1970)   HEMOGLOBIN A1C  11/04/2022 (Originally 09/30/2022)   FOOT EXAM  03/30/2023    Last A1c:  Lab Results  Component Value Date   HGBA1C 6.2 (H) 03/30/2022    ROS: Denies dizziness, LOC, polyuria, polydipsia, unintended weight loss/gain, foot ulcerations, numbness or tingling in extremities, shortness of breath or chest pain.  2.  Skin lesion Patient reports that she saw her dentist and they noticed a spot on her left inner corner of her mouth that seems new.  She does not recall seeing this but admits that she does not particularly pay attention to that area of her mouth.  She has multiple skin lesions that have been chronic for her.  Has never seen a dermatologist but would like to.  ROS: Per HPI  Allergies  Allergen Reactions   Dilantin [Phenytoin] Swelling    Swelling of lymph nodes   Phenytoin Sodium Extended Rash   Past Medical History:  Diagnosis Date   Abnormal Pap smear 1999   Anemia    Asthma    BV (bacterial vaginosis) 05/07/2013   Complication of anesthesia    Takes a while to wake up   DM type 2 with diabetic mixed hyperlipidemia (HCC) 11/13/2017   Hyperlipidemia    Seizures (HCC) 1990   Vaginal discharge 05/07/2013   Vaginal Pap smear, abnormal     Current Outpatient Medications:    blood glucose meter kit and supplies, Dispense based on patient and insurance preference. Use up to four times daily as directed. (FOR ICD-10 E10.9, E11.9)., Disp: 1 each, Rfl: 0   carbamazepine (TEGRETOL) 200 MG tablet, Take 1 tablet (200 mg total) by mouth 2 (two) times daily., Disp: 180  tablet, Rfl: 3   cetirizine (ZYRTEC) 10 MG tablet, Take 10 mg by mouth daily., Disp: , Rfl:    clobetasol (TEMOVATE) 0.05 % external solution, Apply 1 Application topically 2 (two) times daily as needed (itching/ rash on scalp)., Disp: 50 mL, Rfl: 0   fish oil-omega-3 fatty acids 1000 MG capsule, Take 2 g by mouth daily., Disp: , Rfl:    glucose blood test strip, Test twice daily,, Disp: 100 each, Rfl: 12   hydrochlorothiazide (HYDRODIURIL) 12.5 MG tablet, Take 1 tablet (12.5 mg total) by mouth daily as needed (swelling)., Disp: 90 tablet, Rfl: 3   Lancet Device MISC, Test twice daily, Disp: 100 each, Rfl: 12   Multiple Vitamins-Minerals (WOMENS MULTI VITAMIN & MINERAL PO), Take by mouth., Disp: , Rfl:    Red Yeast Rice 600 MG CAPS, Take 600 mg by mouth daily., Disp: , Rfl:    rosuvastatin (CRESTOR) 10 MG tablet, Take 1 tablet (10 mg total) by mouth daily., Disp: 90 tablet, Rfl: 3   Semaglutide, 2 MG/DOSE, 8 MG/3ML SOPN, Inject 2 mg as directed every 7 (seven) days., Disp: 9 mL, Rfl: 3   Vitamin D, Ergocalciferol, (DRISDOL) 1.25 MG (50000 UNIT) CAPS capsule, Take 1 capsule (50,000 Units total) by mouth every 7 (seven) days., Disp: 12 capsule, Rfl: 3 Social History   Socioeconomic History   Marital status: Married    Spouse name: Not on file  Number of children: 2   Years of education: college4   Highest education level: Not on file  Occupational History   Occupation: Hospital doctor: [employer]    Comment: [employer]  Tobacco Use   Smoking status: Never   Smokeless tobacco: Never  Vaping Use   Vaping status: Never Used  Substance and Sexual Activity   Alcohol use: No   Drug use: No   Sexual activity: Yes    Partners: Male    Birth control/protection: Surgical, Post-menopausal    Comment: tubal  Other Topics Concern   Not on file  Social History Narrative   Not on file   Social Determinants of Health   Financial Resource Strain: Not on file  Food  Insecurity: Not on file  Transportation Needs: Not on file  Physical Activity: Not on file  Stress: Not on file  Social Connections: Not on file  Intimate Partner Violence: Not on file   Family History  Problem Relation Age of Onset   Diabetes Mother    Hypertension Mother    Diabetes Father    Heart disease Father    Hypertension Father    Heart disease Brother    Diabetes Maternal Aunt    Hypertension Maternal Aunt    Diabetes Maternal Uncle    Hypertension Maternal Uncle    Colon cancer Neg Hx     Objective: Office vital signs reviewed. BP 114/74   Pulse 80   Temp 98.6 F (37 C)   Ht 5\' 1"  (1.549 m)   Wt 186 lb (84.4 kg)   LMP 12/29/2013   SpO2 95%   BMI 35.14 kg/m   Physical Examination:  General: Awake, alert, well nourished, No acute distress HEENT: sclera white, MMM.  Left inner corner of mouth with 1.25 mm well-circumscribed but irregularly pigmented flat lesion with no appreciable pearlescence, umbilication or hypervascularity. Cardio: regular rate and rhythm, S1S2 heard, no murmurs appreciated Pulm: clear to auscultation bilaterally, no wheezes, rhonchi or rales; normal work of breathing on room air   Assessment/ Plan: 62 y.o. female   Controlled type 2 diabetes mellitus with other specified complication, without long-term current use of insulin (HCC) - Plan: Bayer DCA Hb A1c Waived, Semaglutide, 2 MG/DOSE, 8 MG/3ML SOPN  Long-term current use of injectable noninsulin antidiabetic medication - Plan: Semaglutide, 2 MG/DOSE, 8 MG/3ML SOPN  Hyperlipidemia associated with type 2 diabetes mellitus (HCC) - Plan: Lipid Panel, CMP14+EGFR, Semaglutide, 2 MG/DOSE, 8 MG/3ML SOPN  Vitamin D deficiency - Plan: VITAMIN D 25 Hydroxy (Vit-D Deficiency, Fractures)  Pigmented skin lesions - Plan: Ambulatory referral to Dermatology  Lip lesion - Plan: Ambulatory referral to Dermatology   Sugar controlled but A1c is rising and up to 6.4 today.  Of advance her  semaglutide to 2 mg weekly  She will continue statin therapy.  Lipid panel and liver function test collected today  Did not discuss vitamin D deficiency but will repeat labs with above  Referral to dermatology for newly recognized pigmented skin lesion on the left inner corner of mouth.  Hopefully can evaluate the other skin lesions along her trunk, neck  Shingles #2 vaccine administered today.  Patient to return for influenza later  Raliegh Ip, DO [facility] [phone]

## 2022-10-06 LAB — CMP14+EGFR
ALT: 21 IU/L (ref 0–32)
AST: 18 IU/L (ref 0–40)
Albumin: 4.3 g/dL (ref 3.8–4.9)
Alkaline Phosphatase: 102 IU/L (ref 44–121)
BUN/Creatinine Ratio: 24 (ref 12–28)
BUN: 20 mg/dL (ref 8–27)
Bilirubin Total: <0.2 mg/dL (ref 0.0–1.2)
CO2: 23 mmol/L (ref 20–29)
Calcium: 9.5 mg/dL (ref 8.7–10.3)
Chloride: 101 mmol/L (ref 96–106)
Creatinine, Ser: 0.82 mg/dL (ref 0.57–1.00)
Globulin, Total: 2.4 g/dL (ref 1.5–4.5)
Glucose: 115 mg/dL — ABNORMAL HIGH (ref 70–99)
Potassium: 3.8 mmol/L (ref 3.5–5.2)
Sodium: 143 mmol/L (ref 134–144)
Total Protein: 6.7 g/dL (ref 6.0–8.5)
eGFR: 82 mL/min/{1.73_m2} (ref >59)

## 2022-10-06 LAB — LIPID PANEL
Chol/HDL Ratio: 3 ratio (ref 0.0–4.4)
Cholesterol, Total: 155 mg/dL (ref 100–199)
HDL: 51 mg/dL (ref >39)
LDL Chol Calc (NIH): 85 mg/dL (ref 0–99)
Triglycerides: 104 mg/dL (ref 0–149)
VLDL Cholesterol Cal: 19 mg/dL (ref 5–40)

## 2022-10-06 LAB — VITAMIN D 25 HYDROXY (VIT D DEFICIENCY, FRACTURES): Vit D, 25-Hydroxy: 48.5 ng/mL (ref 30.0–100.0)

## 2022-10-12 ENCOUNTER — Telehealth: Payer: Self-pay | Admitting: Family Medicine

## 2022-10-13 NOTE — Telephone Encounter (Signed)
Referral redirected to Dr. Scharlene Gloss office in [place] as he will have more quicker availability.

## 2022-10-25 LAB — HM MAMMOGRAPHY

## 2022-10-31 ENCOUNTER — Encounter: Payer: Self-pay | Admitting: Family Medicine

## 2023-02-27 ENCOUNTER — Encounter (INDEPENDENT_AMBULATORY_CARE_PROVIDER_SITE_OTHER): Payer: Self-pay | Admitting: *Deleted

## 2023-04-04 ENCOUNTER — Ambulatory Visit: Admitting: Family Medicine

## 2023-04-04 ENCOUNTER — Encounter: Payer: Self-pay | Admitting: Family Medicine

## 2023-04-04 VITALS — BP 105/71 | HR 74 | Temp 98.5°F | Ht 61.0 in | Wt 182.4 lb

## 2023-04-04 DIAGNOSIS — E785 Hyperlipidemia, unspecified: Secondary | ICD-10-CM | POA: Diagnosis not present

## 2023-04-04 DIAGNOSIS — Z23 Encounter for immunization: Secondary | ICD-10-CM

## 2023-04-04 DIAGNOSIS — Z7985 Long-term (current) use of injectable non-insulin antidiabetic drugs: Secondary | ICD-10-CM

## 2023-04-04 DIAGNOSIS — G40109 Localization-related (focal) (partial) symptomatic epilepsy and epileptic syndromes with simple partial seizures, not intractable, without status epilepticus: Secondary | ICD-10-CM | POA: Diagnosis not present

## 2023-04-04 DIAGNOSIS — E1169 Type 2 diabetes mellitus with other specified complication: Secondary | ICD-10-CM

## 2023-04-04 LAB — BAYER DCA HB A1C WAIVED: HB A1C (BAYER DCA - WAIVED): 5.9 % — ABNORMAL HIGH (ref 4.8–5.6)

## 2023-04-04 MED ORDER — SEMAGLUTIDE (2 MG/DOSE) 8 MG/3ML ~~LOC~~ SOPN: 2.0000 mg | PEN_INJECTOR | SUBCUTANEOUS | 3 refills | Status: DC

## 2023-04-04 MED ORDER — GLUCOSE BLOOD VI STRP
ORAL_STRIP | 12 refills | Status: AC
Start: 2023-04-04 — End: ?

## 2023-04-04 MED ORDER — ROSUVASTATIN CALCIUM 10 MG PO TABS: 10.0000 mg | ORAL_TABLET | Freq: Every day | ORAL | 3 refills | Status: DC

## 2023-04-04 MED ORDER — CARBAMAZEPINE 200 MG PO TABS: 200.0000 mg | ORAL_TABLET | Freq: Two times a day (BID) | ORAL | 3 refills | Status: DC

## 2023-04-04 NOTE — Progress Notes (Signed)
 Subjective: CC:DM PCP: Raliegh Ip, DO ZOX:WRUEAVWU Suzanne Jimenez is a 63 y.o. female presenting to clinic today for:  1. Type 2 Diabetes with hyperlipidemia:  Patient reports compliance with her medications.  She reports no hypoglycemic episodes.  She has held her semaglutide for about 2 and half weeks now because she had cataract surgery and subsequent retinal hole repair on the left with Dr. Margaretmary Eddy office at [facility] eye surgery in [place].  She seems to be recovering well from that.  She uses her HCTZ roughly every other day.  She reports no abdominal pain, nausea, vomiting  Diabetes Health Maintenance Due  Topic Date Due   OPHTHALMOLOGY EXAM  Never done   FOOT EXAM  03/30/2023   HEMOGLOBIN A1C  04/04/2023    Last A1c:  Lab Results  Component Value Date   HGBA1C 6.4 (H) 10/05/2022   2.  Seizure disorder Needs refills on Tegretol.  Reports no breakthrough seizure activity.   ROS: Per HPI  Allergies  Allergen Reactions   Dilantin [Phenytoin] Swelling    Swelling of lymph nodes   Phenytoin Sodium Extended Rash   Past Medical History:  Diagnosis Date   Abnormal Pap smear 1999   Anemia    Asthma    BV (bacterial vaginosis) 05/07/2013   Complication of anesthesia    Takes a while to wake up   DM type 2 with diabetic mixed hyperlipidemia (HCC) 11/13/2017   Hyperlipidemia    Seizures (HCC) 1990   Vaginal discharge 05/07/2013   Vaginal Pap smear, abnormal     Current Outpatient Medications:    blood glucose meter kit and supplies, Dispense based on patient and insurance preference. Use up to four times daily as directed. (FOR ICD-10 E10.9, E11.9)., Disp: 1 each, Rfl: 0   carbamazepine (TEGRETOL) 200 MG tablet, Take 1 tablet (200 mg total) by mouth 2 (two) times daily., Disp: 180 tablet, Rfl: 3   cetirizine (ZYRTEC) 10 MG tablet, Take 10 mg by mouth daily., Disp: , Rfl:    clobetasol (TEMOVATE) 0.05 % external solution, Apply 1 Application topically 2 (two)  times daily as needed (itching/ rash on scalp)., Disp: 50 mL, Rfl: 0   fish oil-omega-3 fatty acids 1000 MG capsule, Take 2 g by mouth daily., Disp: , Rfl:    glucose blood test strip, Test twice daily,, Disp: 100 each, Rfl: 12   hydrochlorothiazide (HYDRODIURIL) 12.5 MG tablet, Take 1 tablet (12.5 mg total) by mouth daily as needed (swelling)., Disp: 90 tablet, Rfl: 3   Lancet Device MISC, Test twice daily, Disp: 100 each, Rfl: 12   Multiple Vitamins-Minerals (WOMENS MULTI VITAMIN & MINERAL PO), Take by mouth., Disp: , Rfl:    Red Yeast Rice 600 MG CAPS, Take 600 mg by mouth daily., Disp: , Rfl:    rosuvastatin (CRESTOR) 10 MG tablet, Take 1 tablet (10 mg total) by mouth daily., Disp: 90 tablet, Rfl: 3   Semaglutide, 2 MG/DOSE, 8 MG/3ML SOPN, Inject 2 mg as directed every 7 (seven) days., Disp: 9 mL, Rfl: 3   Vitamin D, Ergocalciferol, (DRISDOL) 1.25 MG (50000 UNIT) CAPS capsule, Take 1 capsule (50,000 Units total) by mouth every 7 (seven) days., Disp: 12 capsule, Rfl: 3 Social History   Socioeconomic History   Marital status: Married    Spouse name: Not on file   Number of children: 2   Years of education: college4   Highest education level: Not on file  Occupational History   Occupation: Child psychotherapist  Employer: [employer]    Comment: [employer]  Tobacco Use   Smoking status: Never   Smokeless tobacco: Never  Vaping Use   Vaping status: Never Used  Substance and Sexual Activity   Alcohol use: No   Drug use: No   Sexual activity: Yes    Partners: Male    Birth control/protection: Surgical, Post-menopausal    Comment: tubal  Other Topics Concern   Not on file  Social History Narrative   Not on file   Social Drivers of Health   Financial Resource Strain: Not on file  Food Insecurity: Not on file  Transportation Needs: Not on file  Physical Activity: Not on file  Stress: Not on file  Social Connections: Not on file  Intimate Partner Violence: Not on file    Family History  Problem Relation Age of Onset   Diabetes Mother    Hypertension Mother    Diabetes Father    Heart disease Father    Hypertension Father    Heart disease Brother    Diabetes Maternal Aunt    Hypertension Maternal Aunt    Diabetes Maternal Uncle    Hypertension Maternal Uncle    Colon cancer Neg Hx     Objective: Office vital signs reviewed. BP 105/71   Pulse 74   Temp 98.5 F (36.9 C)   Ht 5\' 1"  (1.549 m)   Wt 182 lb 6.4 oz (82.7 kg)   LMP 12/29/2013   SpO2 98%   BMI 34.46 kg/m   Physical Examination:  General: Awake, alert, well nourished, No acute distress HEENT: moist mucous membranes Cardio: regular rate and rhythm, S1S2 heard, no murmurs appreciated Pulm: clear to auscultation bilaterally, no wheezes, rhonchi or rales; normal work of breathing on room air Neuro: No focal neurologic deficits.  Follows commands.  Light sensitive currently wearing glasses  Diabetic Foot Exam - Simple   Simple Foot Form Diabetic Foot exam was performed with the following findings: Yes 04/04/2023 10:10 AM  Visual Inspection No deformities, no ulcerations, no other skin breakdown bilaterally: Yes Sensation Testing Intact to touch and monofilament testing bilaterally: Yes Pulse Check Posterior Tibialis and Dorsalis pulse intact bilaterally: Yes Comments      Assessment/ Plan: 63 y.o. 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  No changes needed.  Advised to gradually increase back up to 2 mg weekly of semaglutide given lapse in medication.  Statin renewed.  Not yet due for fasting lipid  Tegretol renewed.  Will plan for CMP, CBC at next visit.  Raliegh Ip, DO [facility] [phone]

## 2023-04-06 LAB — MICROALBUMIN / CREATININE URINE RATIO
Creatinine, Urine: 193.5 mg/dL
Microalb/Creat Ratio: 15 mg/g{creat} (ref 0–29)
Microalbumin, Urine: 29.2 ug/mL

## 2023-04-07 ENCOUNTER — Encounter: Payer: Self-pay | Admitting: Family Medicine

## 2023-05-23 HISTORY — PX: CATARACT EXTRACTION: SUR2

## 2023-09-20 ENCOUNTER — Telehealth: Payer: Self-pay

## 2023-09-20 ENCOUNTER — Other Ambulatory Visit (HOSPITAL_COMMUNITY): Payer: Self-pay

## 2023-09-20 NOTE — Telephone Encounter (Signed)
 Pharmacy Patient Advocate Encounter   Received notification from Onbase that prior authorization for Ozempic  (2 MG/DOSE) 8MG /3ML pen-injectors  is required/requested.   Insurance verification completed.   The patient is insured through [employer] .   Per test claim: PA required; PA submitted to above mentioned insurance via Latent Key/confirmation #/EOC [id number] Status is pending

## 2023-10-03 ENCOUNTER — Other Ambulatory Visit (HOSPITAL_COMMUNITY): Payer: Self-pay

## 2023-10-03 NOTE — Telephone Encounter (Signed)
 PA resubmitted via Latent. New Key: [id number]

## 2023-10-03 NOTE — Telephone Encounter (Signed)
 Pharmacy Patient Advocate Encounter  Received notification from [facility] that Prior Authorization for Ozempic  (2 MG/DOSE) 8MG /3ML pen-injectors  has been DENIED.  See denial reason below. No denial letter attached in CMM. Will attach denial letter to Media tab once received.   PA #/Case ID/Reference #: [id number]

## 2023-10-03 NOTE — Telephone Encounter (Signed)
 Type II controlled DM so of course her A1c is not above 6.5.  However, if you look at BEFORE she was started on meds, she was 6.7 in 03/20/2020.

## 2023-10-03 NOTE — Telephone Encounter (Signed)
 Pharmacy Patient Advocate Encounter  Received notification from [facility] that Prior Authorization for Ozempic  (2 MG/DOSE) 8MG /3ML pen-injectors has been APPROVED from 09/03/23 to 10/02/24. Ran test claim, Copay is $74.99 (3 MONTH SUPPLY). This test claim was processed through [facility]- copay amounts may vary at other pharmacies due to pharmacy/plan contracts, or as the patient moves through the different stages of their insurance plan.   PA #/Case ID/Reference #: [id number]

## 2023-10-04 ENCOUNTER — Other Ambulatory Visit (HOSPITAL_COMMUNITY): Payer: Self-pay

## 2023-10-09 ENCOUNTER — Encounter: Payer: Self-pay | Admitting: Family Medicine

## 2023-10-09 ENCOUNTER — Ambulatory Visit: Admitting: Family Medicine

## 2023-10-09 VITALS — BP 130/81 | HR 76 | Temp 97.7°F | Ht 62.0 in | Wt 181.2 lb

## 2023-10-09 DIAGNOSIS — E785 Hyperlipidemia, unspecified: Secondary | ICD-10-CM

## 2023-10-09 DIAGNOSIS — E1169 Type 2 diabetes mellitus with other specified complication: Secondary | ICD-10-CM

## 2023-10-09 DIAGNOSIS — E66811 Obesity, class 1: Secondary | ICD-10-CM

## 2023-10-09 DIAGNOSIS — Z0001 Encounter for general adult medical examination with abnormal findings: Secondary | ICD-10-CM

## 2023-10-09 DIAGNOSIS — Z7985 Long-term (current) use of injectable non-insulin antidiabetic drugs: Secondary | ICD-10-CM | POA: Diagnosis not present

## 2023-10-09 DIAGNOSIS — G40109 Localization-related (focal) (partial) symptomatic epilepsy and epileptic syndromes with simple partial seizures, not intractable, without status epilepticus: Secondary | ICD-10-CM

## 2023-10-09 DIAGNOSIS — Z0000 Encounter for general adult medical examination without abnormal findings: Secondary | ICD-10-CM

## 2023-10-09 DIAGNOSIS — E559 Vitamin D deficiency, unspecified: Secondary | ICD-10-CM

## 2023-10-09 LAB — LIPID PANEL

## 2023-10-09 LAB — BAYER DCA HB A1C WAIVED: HB A1C (BAYER DCA - WAIVED): 5.9 % — ABNORMAL HIGH (ref 4.8–5.6)

## 2023-10-09 MED ORDER — CARBAMAZEPINE 200 MG PO TABS: 200.0000 mg | ORAL_TABLET | Freq: Two times a day (BID) | ORAL | 3 refills | Status: AC

## 2023-10-09 MED ORDER — SEMAGLUTIDE (2 MG/DOSE) 8 MG/3ML ~~LOC~~ SOPN: 2.0000 mg | PEN_INJECTOR | SUBCUTANEOUS | 3 refills | Status: AC

## 2023-10-09 MED ORDER — ALBUTEROL SULFATE HFA 108 (90 BASE) MCG/ACT IN AERS: 2.0000 | INHALATION_SPRAY | Freq: Four times a day (QID) | RESPIRATORY_TRACT | 0 refills | Status: AC | PRN

## 2023-10-09 MED ORDER — ROSUVASTATIN CALCIUM 10 MG PO TABS
10.0000 mg | ORAL_TABLET | Freq: Every day | ORAL | 3 refills | Status: AC
Start: 2023-10-09 — End: ?

## 2023-10-09 NOTE — Patient Instructions (Signed)
 Preventive Care 58-62 Years Old, Female  Preventive care refers to lifestyle choices and visits with your health care provider that can promote health and wellness. Preventive care visits are also called wellness exams.  What can I expect for my preventive care visit?  Counseling  Your health care provider may ask you questions about your:  Medical history, including:  Past medical problems.  Family medical history.  Pregnancy history.  Current health, including:  Menstrual cycle.  Method of birth control.  Emotional well-being.  Home life and relationship well-being.  Sexual activity and sexual health.  Lifestyle, including:  Alcohol, nicotine or tobacco, and drug use.  Access to firearms.  Diet, exercise, and sleep habits.  Work and work Astronomer.  Sunscreen use.  Safety issues such as seatbelt and bike helmet use.  Physical exam  Your health care provider will check your:  Height and weight. These may be used to calculate your BMI (body mass index). BMI is a measurement that tells if you are at a healthy weight.  Waist circumference. This measures the distance around your waistline. This measurement also tells if you are at a healthy weight and may help predict your risk of certain diseases, such as type 2 diabetes and high blood pressure.  Heart rate and blood pressure.  Body temperature.  Skin for abnormal spots.  What immunizations do I need?    Vaccines are usually given at various ages, according to a schedule. Your health care provider will recommend vaccines for you based on your age, medical history, and lifestyle or other factors, such as travel or where you work.  What tests do I need?  Screening  Your health care provider may recommend screening tests for certain conditions. This may include:  Lipid and cholesterol levels.  Diabetes screening. This is done by checking your blood sugar (glucose) after you have not eaten for a while (fasting).  Pelvic exam and Pap test.  Hepatitis B test.  Hepatitis C  test.  HIV (human immunodeficiency virus) test.  STI (sexually transmitted infection) testing, if you are at risk.  Lung cancer screening.  Colorectal cancer screening.  Mammogram. Talk with your health care provider about when you should start having regular mammograms. This may depend on whether you have a family history of breast cancer.  BRCA-related cancer screening. This may be done if you have a family history of breast, ovarian, tubal, or peritoneal cancers.  Bone density scan. This is done to screen for osteoporosis.  Talk with your health care provider about your test results, treatment options, and if necessary, the need for more tests.  Follow these instructions at home:  Eating and drinking    Eat a diet that includes fresh fruits and vegetables, whole grains, lean protein, and low-fat dairy products.  Take vitamin and mineral supplements as recommended by your health care provider.  Do not drink alcohol if:  Your health care provider tells you not to drink.  You are pregnant, may be pregnant, or are planning to become pregnant.  If you drink alcohol:  Limit how much you have to 0-1 drink a day.  Know how much alcohol is in your drink. In the [place] one drink equals one 12 oz bottle of beer (355 mL), one 5 oz glass of wine (148 mL), or one 1 oz glass of hard liquor (44 mL).  Lifestyle  Brush your teeth every morning and night with fluoride toothpaste. Floss one time each day.  Exercise for at least  30 minutes 5 or more days each week.  Do not use any products that contain nicotine or tobacco. These products include cigarettes, chewing tobacco, and vaping devices, such as e-cigarettes. If you need help quitting, ask your health care provider.  Do not use drugs.  If you are sexually active, practice safe sex. Use a condom or other form of protection to prevent STIs.  If you do not wish to become pregnant, use a form of birth control. If you plan to become pregnant, see your health care provider for a  prepregnancy visit.  Take aspirin only as told by your health care provider. Make sure that you understand how much to take and what form to take. Work with your health care provider to find out whether it is safe and beneficial for you to take aspirin daily.  Find healthy ways to manage stress, such as:  Meditation, yoga, or listening to music.  Journaling.  Talking to a trusted person.  Spending time with friends and family.  Minimize exposure to UV radiation to reduce your risk of skin cancer.  Safety  Always wear your seat belt while driving or riding in a vehicle.  Do not drive:  If you have been drinking alcohol. Do not ride with someone who has been drinking.  When you are tired or distracted.  While texting.  If you have been using any mind-altering substances or drugs.  Wear a helmet and other protective equipment during sports activities.  If you have firearms in your house, make sure you follow all gun safety procedures.  Seek help if you have been physically or sexually abused.  What's next?  Visit your health care provider once a year for an annual wellness visit.  Ask your health care provider how often you should have your eyes and teeth checked.  Stay up to date on all vaccines.  This information is not intended to replace advice given to you by your health care provider. Make sure you discuss any questions you have with your health care provider.  Document Revised: 06/24/2020 Document Reviewed: 06/24/2020  Elsevier Patient Education  2024 [employer].

## 2023-10-09 NOTE — Addendum Note (Signed)
 Addended by: JOLINDA NORENE HERO on: 10/09/2023 11:25 AM   Modules accepted: Orders

## 2023-10-09 NOTE — Progress Notes (Signed)
 Suzanne Jimenez is a 63 y.o. female presents to office today for annual physical exam examination.     Type 2 Diabetes with hypertension, hyperlipidemia:  Reports that she has been doing well.  She is been compliant with Ozempic  2 mg weekly.  Uses HCTZ as needed edema.  Crestor  daily.  Not utilizing her OTC cholesterol medicines anymore.  No chest pain, shortness of breath, change in exercise tolerance, numbness or tingling of the feet.  No dizziness  Last eye exam: needs Last foot exam: UTD Last A1c:  Lab Results  Component Value Date   HGBA1C 5.9 (H) 04/04/2023   Nephropathy screen indicated?: UTD Last flu, zoster and/or pneumovax:  Immunization History  Administered Date(s) Administered   Influenza, Quadrivalent, Recombinant, Inj, Pf 11/12/2018   Influenza,inj,Quad PF,6+ Mos 10/17/2012, 10/09/2013, 10/14/2014, 10/24/2016   Influenza,inj,quad, With Preservative 11/02/2017   Influenza-Unspecified 10/26/1992, 10/25/1996, 12/13/1999, 11/20/2003, 10/13/2004, 11/07/2005, 11/10/2006, 10/01/2007, 10/23/2008, 10/27/2009, 11/02/2011, 10/17/2012, 10/09/2013, 10/14/2014   Moderna Sars-Covid-2 Vaccination 03/21/2019, 04/20/2019   Pneumococcal Polysaccharide-23 02/09/2018   Tdap 07/29/2009, 04/04/2023   Zoster Recombinant(Shingrix ) 03/30/2022, 10/04/2022   Still has not followed up with dermatology yet for cheek lesion but was seen by her dentist recently and there was no change   Health Maintenance Due  Topic Date Due   OPHTHALMOLOGY EXAM  Never done   Pneumococcal Vaccine: 50+ Years (2 of 2 - PCV) 02/10/2019   COVID-19 Vaccine (3 - Moderna risk series) 05/18/2019   Influenza Vaccine  08/11/2023   Diabetic kidney evaluation - eGFR measurement  10/05/2023   HEMOGLOBIN A1C  10/05/2023    Immunization History  Administered Date(s) Administered   Influenza, Quadrivalent, Recombinant, Inj, Pf 11/12/2018   Influenza,inj,Quad PF,6+ Mos 10/17/2012, 10/09/2013, 10/14/2014, 10/24/2016    Influenza,inj,quad, With Preservative 11/02/2017   Influenza-Unspecified 10/26/1992, 10/25/1996, 12/13/1999, 11/20/2003, 10/13/2004, 11/07/2005, 11/10/2006, 10/01/2007, 10/23/2008, 10/27/2009, 11/02/2011, 10/17/2012, 10/09/2013, 10/14/2014   Moderna Sars-Covid-2 Vaccination 03/21/2019, 04/20/2019   Pneumococcal Polysaccharide-23 02/09/2018   Tdap 07/29/2009, 04/04/2023   Zoster Recombinant(Shingrix ) 03/30/2022, 10/04/2022   Past Medical History:  Diagnosis Date   Abnormal Pap smear 1999   Anemia    Asthma    BV (bacterial vaginosis) 05/07/2013   Complication of anesthesia    Takes a while to wake up   DM type 2 with diabetic mixed hyperlipidemia (HCC) 11/13/2017   Hyperlipidemia    Seizures (HCC) 1990   Vaginal discharge 05/07/2013   Vaginal Pap smear, abnormal    Social History   Socioeconomic History   Marital status: Married    Spouse name: Not on file   Number of children: 2   Years of education: college4   Highest education level: Not on file  Occupational History   Occupation: Hospital doctor: [employer]    Comment: [place]  Tobacco Use   Smoking status: Never   Smokeless tobacco: Never  Vaping Use   Vaping status: Never Used  Substance and Sexual Activity   Alcohol use: No   Drug use: No   Sexual activity: Yes    Partners: Male    Birth control/protection: Surgical, Post-menopausal    Comment: tubal  Other Topics Concern   Not on file  Social History Narrative   Not on file   Social Drivers of Health   Financial Resource Strain: Low Risk  (04/04/2023)   Overall Financial Resource Strain (CARDIA)    Difficulty of Paying Living Expenses: Not hard at all  Food Insecurity: No Food Insecurity (04/04/2023)  Hunger Vital Sign    Worried About Running Out of Food in the Last Year: Never true    Ran Out of Food in the Last Year: Never true  Transportation Needs: No Transportation Needs (04/04/2023)   PRAPARE - Doctor, general practice (Medical): No    Lack of Transportation (Non-Medical): No  Physical Activity: Insufficiently Active (04/04/2023)   Exercise Vital Sign    Days of Exercise per Week: 4 days    Minutes of Exercise per Session: 30 min  Stress: No Stress Concern Present (04/04/2023)   [employer] of Occupational Health - Occupational Stress Questionnaire    Feeling of Stress : Not at all  Social Connections: Socially Integrated (04/04/2023)   Social Connection and Isolation Panel    Frequency of Communication with Friends and Family: Three times a week    Frequency of Social Gatherings with Friends and Family: Three times a week    Attends Religious Services: More than 4 times per year    Active Member of Clubs or Organizations: Yes    Attends Banker Meetings: More than 4 times per year    Marital Status: Married  Catering manager Violence: Not At Risk (04/04/2023)   Humiliation, Afraid, Rape, and Kick questionnaire    Fear of Current or Ex-Partner: No    Emotionally Abused: No    Physically Abused: No    Sexually Abused: No   Past Surgical History:  Procedure Laterality Date   BRAIN SURGERY  1990   CESAREAN SECTION  2004   COLONOSCOPY N/A 03/13/2013   Procedure: COLONOSCOPY;  Surgeon: Claudis RAYMOND Rivet, MD;  Location: [facility];  Service: Endoscopy;  Laterality: N/A;  830   GYNECOLOGIC CRYOSURGERY  1999   Abnormal Pap   TUBAL LIGATION     Family History  Problem Relation Age of Onset   Diabetes Mother    Hypertension Mother    Diabetes Father    Heart disease Father    Hypertension Father    Heart disease Brother    Diabetes Maternal Aunt    Hypertension Maternal Aunt    Diabetes Maternal Uncle    Hypertension Maternal Uncle    Colon cancer Neg Hx     Current Outpatient Medications:    blood glucose meter kit and supplies, Dispense based on patient and insurance preference. Use up to four times daily as directed. (FOR ICD-10 E10.9, E11.9)., Disp: 1  each, Rfl: 0   carbamazepine  (TEGRETOL ) 200 MG tablet, Take 1 tablet (200 mg total) by mouth 2 (two) times daily., Disp: 180 tablet, Rfl: 3   cetirizine (ZYRTEC) 10 MG tablet, Take 10 mg by mouth daily., Disp: , Rfl:    clobetasol  (TEMOVATE ) 0.05 % external solution, Apply 1 Application topically 2 (two) times daily as needed (itching/ rash on scalp)., Disp: 50 mL, Rfl: 0   fish oil-omega-3 fatty acids 1000 MG capsule, Take 2 g by mouth daily., Disp: , Rfl:    glucose blood test strip, Test daily. E11.9. using one touch, Disp: 100 each, Rfl: 12   hydrochlorothiazide  (HYDRODIURIL ) 12.5 MG tablet, Take 1 tablet (12.5 mg total) by mouth daily as needed (swelling)., Disp: 90 tablet, Rfl: 3   Lancet Device MISC, Test twice daily, Disp: 100 each, Rfl: 12   Multiple Vitamins-Minerals (WOMENS MULTI VITAMIN & MINERAL PO), Take by mouth., Disp: , Rfl:    Red Yeast Rice 600 MG CAPS, Take 600 mg by mouth daily., Disp: , Rfl:  rosuvastatin  (CRESTOR ) 10 MG tablet, Take 1 tablet (10 mg total) by mouth daily., Disp: 90 tablet, Rfl: 3   Semaglutide , 2 MG/DOSE, 8 MG/3ML SOPN, Inject 2 mg as directed every 7 (seven) days., Disp: 9 mL, Rfl: 3  Allergies  Allergen Reactions   Dilantin [Phenytoin] Swelling    Swelling of lymph nodes   Phenytoin Sodium Extended Rash     ROS: Review of Systems Pertinent items noted in HPI and remainder of comprehensive ROS otherwise negative.    Physical exam BP 130/81   Pulse 76   Temp 97.7 F (36.5 C)   Ht 5' 2 (1.575 m)   Wt 181 lb 4 oz (82.2 kg)   LMP 12/29/2013   SpO2 97%   BMI 33.15 kg/m  General appearance: alert, cooperative, appears stated age, no distress, and mildly obese Head: Normocephalic, without obvious abnormality, atraumatic Eyes: negative findings: lids and lashes normal, conjunctivae and sclerae normal, corneas clear, and pupils equal, round, reactive to light and accomodation Ears: normal TM's and external ear canals both ears Nose: Nares  normal. Septum midline. Mucosa normal. No drainage or sinus tenderness. Throat: lips, mucosa, and tongue normal; teeth and gums normal Neck: no adenopathy, supple, symmetrical, trachea midline, and thyroid  not enlarged, symmetric, no tenderness/mass/nodules Back: symmetric, no curvature. ROM normal. No CVA tenderness. Lungs: clear to auscultation bilaterally Heart: regular rate and rhythm, S1, S2 normal, no murmur, click, rub or gallop Abdomen: soft, non-tender; bowel sounds normal; no masses,  no organomegaly Extremities: extremities normal, atraumatic, no cyanosis or edema Pulses: 2+ and symmetric Skin: Several pigmented skin lesions along the face, consistent with seborrheic keratoses, and neck Lymph nodes: Cervical, supraclavicular, and axillary nodes normal. Neurologic: Grossly normal      04/04/2023    8:52 AM 10/05/2022    8:14 AM 03/30/2022    8:49 AM  Depression screen PHQ 2/9  Decreased Interest 0 0 0  Down, Depressed, Hopeless 0 0 0  PHQ - 2 Score 0 0 0  Altered sleeping 0 0 0  Tired, decreased energy 0 0 0  Change in appetite 0 0 0  Feeling bad or failure about yourself  0 0 0  Trouble concentrating 0 0 0  Moving slowly or fidgety/restless 0 0 0  Suicidal thoughts 0 0 0  PHQ-9 Score 0 0 0  Difficult doing work/chores Not difficult at all  Not difficult at all      04/04/2023    8:52 AM 10/05/2022    8:18 AM 03/30/2022    8:49 AM 10/29/2021    8:05 AM  GAD 7 : Generalized Anxiety Score  Nervous, Anxious, on Edge 0 0 0 0  Control/stop worrying 0 0 0 0  Worry too much - different things 0 0 0 0  Trouble relaxing 0 0 0 0  Restless 0 0 0 0  Easily annoyed or irritable 0 0 0 0  Afraid - awful might happen 0 0 0 0  Total GAD 7 Score 0 0 0 0  Anxiety Difficulty Not difficult at all Not difficult at all Not difficult at all Not difficult at all    No results found for this or any previous visit (from the past 2160 hours).   Assessment/ Plan: Avelina JENEANE Rubin  here for annual physical exam.   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  Max A1c has been 6.7.  She has had well-controlled blood sugar with the Ozempic .  No changes.  Diabetic foot exam performed today.  Will complete urine microalbumin at next visit.  Fasting labs collected today.  All medications have been renewed.  Tegretol  renewed and she is been asymptomatic from a seizure standpoint  Counseled on healthy lifestyle choices, including diet (rich in fruits, vegetables and lean meats and low in salt and simple carbohydrates) and exercise (at least 30 minutes of moderate physical activity daily).  Patient to follow up 46m for DM  Khalil Szczepanik M. Jolinda, DO

## 2023-10-10 ENCOUNTER — Ambulatory Visit: Payer: Self-pay | Admitting: Family Medicine

## 2023-10-10 DIAGNOSIS — E559 Vitamin D deficiency, unspecified: Secondary | ICD-10-CM

## 2023-10-10 LAB — LIPID PANEL
Cholesterol, Total: 187 mg/dL (ref 100–199)
HDL: 54 mg/dL (ref >39)
LDL CALC COMMENT:: 3.5 ratio (ref 0.0–4.4)
LDL Chol Calc (NIH): 116 mg/dL — AB (ref 0–99)
Triglycerides: 91 mg/dL (ref 0–149)
VLDL Cholesterol Cal: 17 mg/dL (ref 5–40)

## 2023-10-10 LAB — CMP14+EGFR
ALT: 18 IU/L (ref 0–32)
AST: 18 IU/L (ref 0–40)
Albumin: 4.3 g/dL (ref 3.9–4.9)
Alkaline Phosphatase: 112 IU/L (ref 49–135)
BUN/Creatinine Ratio: 18 (ref 12–28)
BUN: 14 mg/dL (ref 8–27)
Bilirubin Total: <0.2 mg/dL (ref 0.0–1.2)
CO2: 22 mmol/L (ref 20–29)
Calcium: 9.5 mg/dL (ref 8.7–10.3)
Chloride: 107 mmol/L — AB (ref 96–106)
Creatinine, Ser: 0.77 mg/dL (ref 0.57–1.00)
Globulin, Total: 2.6 g/dL (ref 1.5–4.5)
Glucose: 93 mg/dL (ref 70–99)
Potassium: 4.1 mmol/L (ref 3.5–5.2)
Sodium: 143 mmol/L (ref 134–144)
Total Protein: 6.9 g/dL (ref 6.0–8.5)
eGFR: 88 mL/min/1.73 (ref >59)

## 2023-10-10 LAB — TSH: TSH: 2.43 u[IU]/mL (ref 0.450–4.500)

## 2023-10-10 LAB — CBC WITH DIFFERENTIAL/PLATELET
Basophils Absolute: 0 x10E3/uL (ref 0.0–0.2)
Basos: 1 %
EOS (ABSOLUTE): 0.1 x10E3/uL (ref 0.0–0.4)
Eos: 3 %
Hematocrit: 37.5 % (ref 34.0–46.6)
Hemoglobin: 11.7 g/dL (ref 11.1–15.9)
Immature Grans (Abs): 0 x10E3/uL (ref 0.0–0.1)
Immature Granulocytes: 0 %
Lymphocytes Absolute: 2.3 x10E3/uL (ref 0.7–3.1)
Lymphs: 45 %
MCH: 28 pg (ref 26.6–33.0)
MCHC: 31.2 g/dL — ABNORMAL LOW (ref 31.5–35.7)
MCV: 90 fL (ref 79–97)
Monocytes Absolute: 0.4 x10E3/uL (ref 0.1–0.9)
Monocytes: 8 %
Neutrophils Absolute: 2.1 x10E3/uL (ref 1.4–7.0)
Neutrophils: 42 %
Platelets: 284 x10E3/uL (ref 150–450)
RBC: 4.18 x10E6/uL (ref 3.77–5.28)
RDW: 13.7 % (ref 11.7–15.4)
WBC: 5.1 x10E3/uL (ref 3.4–10.8)

## 2023-10-10 LAB — VITAMIN D 25 HYDROXY (VIT D DEFICIENCY, FRACTURES): Vit D, 25-Hydroxy: 19 ng/mL — AB (ref 30.0–100.0)

## 2023-10-10 MED ORDER — VITAMIN D (ERGOCALCIFEROL) 1.25 MG (50000 UNIT) PO CAPS: 50000.0000 [IU] | ORAL_CAPSULE | ORAL | 3 refills | Status: AC

## 2023-12-20 LAB — HM MAMMOGRAPHY

## 2023-12-26 ENCOUNTER — Encounter: Payer: Self-pay | Admitting: Family Medicine

## 2024-04-09 ENCOUNTER — Ambulatory Visit: Payer: Self-pay | Admitting: Family Medicine

## 2024-10-14 ENCOUNTER — Encounter: Payer: Self-pay | Admitting: Family Medicine
